# Patient Record
Sex: Male | Born: 1970 | Race: White | Hispanic: Yes | Marital: Married | Smoking: Never smoker
Health system: Southern US, Community
[De-identification: ages and names within clinical notes are randomized; demographics above are authoritative.]

## PROBLEM LIST (undated history)

## (undated) DIAGNOSIS — K219 Gastro-esophageal reflux disease without esophagitis: Secondary | ICD-10-CM

## (undated) HISTORY — DX: Gastro-esophageal reflux disease without esophagitis: K21.9

---

## 2004-02-27 ENCOUNTER — Encounter: Admission: RE | Admit: 2004-02-27 | Discharge: 2004-02-27 | Payer: Self-pay | Admitting: Family Medicine

## 2006-05-21 ENCOUNTER — Ambulatory Visit: Payer: Self-pay | Admitting: Family Medicine

## 2006-10-15 ENCOUNTER — Ambulatory Visit: Payer: Self-pay | Admitting: Sports Medicine

## 2006-10-17 ENCOUNTER — Ambulatory Visit: Payer: Self-pay | Admitting: Family Medicine

## 2006-10-30 DIAGNOSIS — K219 Gastro-esophageal reflux disease without esophagitis: Secondary | ICD-10-CM | POA: Insufficient documentation

## 2006-10-30 DIAGNOSIS — R079 Chest pain, unspecified: Secondary | ICD-10-CM | POA: Insufficient documentation

## 2006-10-30 DIAGNOSIS — IMO0002 Reserved for concepts with insufficient information to code with codable children: Secondary | ICD-10-CM

## 2006-11-21 ENCOUNTER — Ambulatory Visit (HOSPITAL_COMMUNITY): Admission: RE | Admit: 2006-11-21 | Discharge: 2006-11-21 | Payer: Self-pay | Admitting: Family Medicine

## 2006-11-21 ENCOUNTER — Ambulatory Visit: Payer: Self-pay | Admitting: Family Medicine

## 2006-11-21 ENCOUNTER — Encounter (INDEPENDENT_AMBULATORY_CARE_PROVIDER_SITE_OTHER): Payer: Self-pay | Admitting: Family Medicine

## 2006-11-24 ENCOUNTER — Ambulatory Visit: Payer: Self-pay | Admitting: Family Medicine

## 2006-11-24 ENCOUNTER — Encounter (INDEPENDENT_AMBULATORY_CARE_PROVIDER_SITE_OTHER): Payer: Self-pay | Admitting: Family Medicine

## 2006-11-24 LAB — CONVERTED CEMR LAB
Cholesterol: 191 mg/dL (ref 0–200)
HDL: 38 mg/dL — ABNORMAL LOW (ref >39)
Total CHOL/HDL Ratio: 5
Triglycerides: 72 mg/dL (ref <150)
VLDL: 14 mg/dL (ref 0–40)

## 2006-11-25 ENCOUNTER — Encounter (INDEPENDENT_AMBULATORY_CARE_PROVIDER_SITE_OTHER): Payer: Self-pay | Admitting: *Deleted

## 2006-11-25 ENCOUNTER — Encounter (INDEPENDENT_AMBULATORY_CARE_PROVIDER_SITE_OTHER): Payer: Self-pay | Admitting: Family Medicine

## 2007-07-16 ENCOUNTER — Encounter (INDEPENDENT_AMBULATORY_CARE_PROVIDER_SITE_OTHER): Payer: Self-pay | Admitting: Family Medicine

## 2007-08-13 ENCOUNTER — Ambulatory Visit: Payer: Self-pay | Admitting: Family Medicine

## 2007-08-13 DIAGNOSIS — M25569 Pain in unspecified knee: Secondary | ICD-10-CM

## 2008-06-17 ENCOUNTER — Telehealth (INDEPENDENT_AMBULATORY_CARE_PROVIDER_SITE_OTHER): Payer: Self-pay | Admitting: *Deleted

## 2009-06-15 ENCOUNTER — Ambulatory Visit: Payer: Self-pay | Admitting: Family Medicine

## 2009-06-15 DIAGNOSIS — K089 Disorder of teeth and supporting structures, unspecified: Secondary | ICD-10-CM | POA: Insufficient documentation

## 2009-09-08 ENCOUNTER — Telehealth: Payer: Self-pay | Admitting: Family Medicine

## 2009-09-08 ENCOUNTER — Ambulatory Visit: Payer: Self-pay | Admitting: Family Medicine

## 2009-09-08 DIAGNOSIS — S058X9A Other injuries of unspecified eye and orbit, initial encounter: Secondary | ICD-10-CM | POA: Insufficient documentation

## 2009-09-18 ENCOUNTER — Telehealth: Payer: Self-pay | Admitting: Family Medicine

## 2009-09-19 ENCOUNTER — Encounter: Payer: Self-pay | Admitting: Family Medicine

## 2009-09-19 ENCOUNTER — Ambulatory Visit: Payer: Self-pay | Admitting: Family Medicine

## 2009-09-22 ENCOUNTER — Encounter: Payer: Self-pay | Admitting: Family Medicine

## 2010-10-03 NOTE — Miscellaneous (Signed)
Summary: dentist number  Clinical Lists Changes Lonnie Holt, our interpretor called with the number to his dentist. he told her we wanted this number. [phone].Golden Circle RN  September 19, 2009 3:08 PM

## 2010-10-03 NOTE — Assessment & Plan Note (Signed)
Summary: something in eye/[place]   Vital Signs:  Patient profile:   40 year old male Height:      65.75 inches Weight:      165 pounds BMI:     26.93 Temp:     97.5 degrees F Pulse rate:   74 / minute BP sitting:   120 / 83  (right arm) Cuff size:   regular  Vitals Entered By: Dennison Nancy RN (September 08, 2009 4:25 PM) CC: [place] for someting in right eye   Primary Care Provider:  Asher Muir MD  CC:  [place] for someting in right eye.  History of Present Illness: 40 yo male with c/o of something in his right eye.  Translator present during exam. Was working in his garage at home, using a power saw and no goggles.  Was cutting wood and metal, felt something shoot into his eye.  This happened 2 days ago.  C/o of blurry vision and pain.  No loss of vision.  Has not removed anything from his eye.  Has not used any drops or seen anyone prior to exam today.   Habits & Providers  Alcohol-Tobacco-Diet     Tobacco Status: never  Allergies (verified): No Known Drug Allergies  Past History:  Social History: Last updated: 11/21/2006 LIVES W/ WIFE AND SISTER-IN-LAW.  2 KIDS. BEEN IN [place] SINCE 2000, FROM [place].   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  pREVIOUSLY WAS A [employer].  DENIES SMOKING.  RARE ETOH.  NO DRUGS.  Risk Factors: Smoking Status: never (09/08/2009) Passive Smoke Exposure: no (11/21/2006)  Physical Exam  General:  Well-developed,well-nourished,in no acute distress; alert,appropriate and cooperative throughout examination Eyes:  vision grossly intact, pupils equal, pupils round, and pupils reactive to light.  On woods lamp exam, small corneal abrasion noted at 7 oclock in right eye.  No foregin objects noted   Impression & Recommendations:  Problem # 1:  CORNEAL ABRASION, LEFT (ICD-918.1) Assessment New Verified corneal abrasion with woods lamp exam.  No forgein body noted in either eye. Gave pt tobradex and  diclofenac eye drops, please see pt instructions. Orders: [facility]- Est Level  3 ([id number])  Complete Medication List: 1)  Aleve 220 Mg Tabs (Naproxen sodium) .... Two tablets twice a day for pain.  please take for 7 days 2)  Tramadol Hcl 50 Mg Tabs (Tramadol hcl) .... Take 1 tablet every 6 hous as needed or pain 3)  Penicillin V Potassium 500 Mg Tabs (Penicillin v potassium) .... Take two times a day x 10 days. please give instructions in spanish.  Patient Instructions: 1)  Use tobradex, one drop every 6 hours for 3 days 2)  Use 1 drop of diclofenac in every 6 hours x 3 days for pain 3)  If vision worsen, go to ED

## 2010-10-03 NOTE — Assessment & Plan Note (Signed)
Summary: dental pain, bleeding at gums now/ls   Vital Signs:  Patient profile:   40 year old male Height:      65.75 inches Weight:      168 pounds BMI:     27.42 BSA:     1.85 Temp:     116 degrees F Pulse rate:   76 / minute  Vitals Entered By: Jone Baseman CMA (September 19, 2009 9:25 AM) CC: dental pain and bleeding gums Is Patient Diabetic? No Pain Assessment Patient in pain? yes     Location: right side of mouth Intensity: 6   Primary Care Provider:  Asher Muir MD  CC:  dental pain and bleeding gums.  History of Present Illness: 40 y/o male seen in clinic in October 2010 for dental pain. given rx for pain meds and antibiotics. states pain in R molar improved but is now returning and progressively worsening. awaiting referral to dentistry. no swelling, difficulty eating, fever. occasional taste of blood in his mouth. through phone interpreter, patient states he completed antibiotics about 2 weeks ago (not sure where he obtained those antibiotics from).   Habits & Providers  Alcohol-Tobacco-Diet     Tobacco Status: never  Current Medications (verified): 1)  Aleve 220 Mg  Tabs (Naproxen Sodium) .... Two Tablets Twice A Day For Pain.  Please Take For 7 Days  Allergies (verified): No Known Drug Allergies  Physical Exam  General:  Well-developed,well-nourished,in no acute distress; alert,appropriate and cooperative throughout examination Mouth:  fair dentition., no obvious gum swelling or redness around molars bilateral,  No pus drainage. No fluctuance present at gum line.   Impression & Recommendations:  Problem # 1:  DENTAL PAIN (ICD-525.9) Assessment Deteriorated  reiterated that dental referrals for uninsured take a long time. patient states that dentist he went to is just waiting on referral from [place]. due to confusion, patient to obtain number for dentist office. no indication for antibiotics at this time. will send note to PCP.   Orders: [facility]- Est  Level  3 ([id number])  Complete Medication List: 1)  Aleve 220 Mg Tabs (Naproxen sodium) .... Two tablets twice a day for pain.  please take for 7 days

## 2010-10-03 NOTE — Progress Notes (Signed)
Summary: triage  Phone Note Call from Patient Call back at Home Phone [phone]   Caller: Spouse Summary of Call: has something in eye and needs to be seen today Initial call taken by: De Nurse,  September 08, 2009 11:41 AM  Follow-up for Phone Call        wife's english was not adequate. appt at 3pm workin. interpretor arranged he has something in his eye & it hurts. unable to come at 1:30 Follow-up by: Golden Circle RN,  September 08, 2009 11:42 AM

## 2010-10-03 NOTE — Miscellaneous (Signed)
Summary: dental referral ordered  Clinical Lists Changes  Orders: Added new Referral order of Dental Referral (Dentist) - Signed

## 2010-10-03 NOTE — Progress Notes (Signed)
Summary: referral  Phone Note Call from Patient Call back at Home Phone [phone]   Caller: Madie Reno Summary of Call: needs to be referred to Dental Clinic Initial call taken by: De Nurse,  September 18, 2009 3:00 PM  Follow-up for Phone Call        explained that referral was sent to dental clinic in October and it can take months to get appointment. patient is having much pain now and bleeding gums.  appointment scheduled tomorrow for work in .[place] Follow-up by: Theresia Lo RN,  September 18, 2009 3:38 PM

## 2010-10-21 ENCOUNTER — Encounter: Payer: Self-pay | Admitting: *Deleted

## 2010-12-19 ENCOUNTER — Inpatient Hospital Stay (INDEPENDENT_AMBULATORY_CARE_PROVIDER_SITE_OTHER)
Admission: RE | Admit: 2010-12-19 | Discharge: 2010-12-19 | Disposition: A | Discharge status: Home / Self Care | Payer: Self-pay | Source: Ambulatory Visit | Attending: Family Medicine | Admitting: Family Medicine

## 2010-12-19 DIAGNOSIS — T22019A Burn of unspecified degree of unspecified forearm, initial encounter: Secondary | ICD-10-CM

## 2011-01-08 ENCOUNTER — Ambulatory Visit (INDEPENDENT_AMBULATORY_CARE_PROVIDER_SITE_OTHER): Payer: Self-pay | Admitting: Sports Medicine

## 2011-01-08 ENCOUNTER — Encounter: Payer: Self-pay | Admitting: Sports Medicine

## 2011-01-08 VITALS — BP 125/82 | HR 80 | Temp 98.1°F | Wt 170.0 lb

## 2011-01-08 DIAGNOSIS — M25519 Pain in unspecified shoulder: Secondary | ICD-10-CM

## 2011-01-08 DIAGNOSIS — M25511 Pain in right shoulder: Secondary | ICD-10-CM | POA: Insufficient documentation

## 2011-01-08 MED ORDER — MELOXICAM 7.5 MG PO TABS: ORAL_TABLET | ORAL | Status: AC

## 2011-01-08 NOTE — Assessment & Plan Note (Addendum)
With impingement signs present. Moderate protruding bursa and gap in supraspinatus tendon on MSK [place]. Resolution in pain on injection. Suspect rotator cuff impingement syndrome. Mobic for pain. Exercises given. RTC prn.

## 2011-01-08 NOTE — Patient Instructions (Addendum)
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 El manguito rotador est formado por cuatro tendones y msculos que controlan gran parte de la funcin del hombro y Probation officer.  La bursa subacromial es un saco lleno de lquido que ayuda a reducir la friccin entre el manguito rotador y uno de los huesos del hombro (acromion). El sndrome de atrapamiento suele ser Neomia Dear lesin por uso excesivo que causa inflamacin de la bursa (bursitis), inflamacin del tendn (tendinitis), o un desgarro del tendn (esguince).  Los esguinces se clasifican en tres categoras. Los esguinces de grado 1 ocasionan dolor, pero el tendn no est alargado. En los esguinces de grado 2 hay un ligamento alargado, debido a un estiramiento o desgarro parcial. En el esguince de [place] 2 an se conserva la funcin, aunque puede estar disminuda. Un esguince en grado 3 es la ruptura completa del msculo o el tendn, y suele quedar incapacitada la funcin.   SNTOMAS  Dolor alrededor del hombro, frecuentemente en la porcin externa de la parte superior del brazo.  El dolor empeora al mover el hombro, en especial cuando se lo levanta o se lo coloca por encima de la cabeza.  En ocasiones hay dolor cuando no se utiliza el brazo.  El dolor puede despertarlo durante la noche.  A veces, puede sentir dolor, sensibilidad, hinchazn, calor y enrojecimiento en la zona afectada.  Prdida de la fuerza.  Movimiento limitado del hombro, en especial para moverlo hacia atrs (cmo para alcanzar el bolsillo trasero o desabrocharse el sostn) o al cruzar el cuerpo.    Ruido de "crack" (crepitacin) al Educational psychologist.  Puede sentir dolor en el tendn del bceps e hinchazn (en la zona  anterior del hombro). Empeora al doblar el codo o levantar peso.   CAUSAS El sndrome de compresin es a menudo una lesin por abuso, [employer] movimientos crnicos (repetitivos) ocasionan que los tendones o la bursa se inflamen.   El esguince se produce cuando se produce una fuerza en el tendn o msculo que es mayor de lo que puede soportar. Los mecanismos ms frecuentes de una lesin son: Esguince provocado por un brusco incremento en la duracin, frecuencia o en la intensidad del entrenamiento.  Golpe directo en el hombro (traumatismo).  Envejecimiento, degeneracin del tendn con el uso normal.  Bulto seo en el hombro (osteofito acromial).   LOS RIESGOS AUMENTAN CON  Deportes de contacto (ftbol, lucha o boxeo).  Deportes en los que hay que arrojar objetos (bisbol, tenis y vley).  Levantamiento de pesas y fisicoculturismo.  Trabajos pesados.  Lesin previa en el manguito rotador, inclusive choque.  Poca fuerza y flexibilidad del hombro.  Precalentamiento y elongacin inadecuados antes de la [place].  Equipo protector inadecuado.  La edad  Bulto seo en el hombro (osteofito acromial).   MEDIDAS DE PREVENCIN  Precalentamiento adecuado y elongacin antes de la [place].  Descanso y recuperacin entre actividades.  Mantener la forma fsica: l Fuerza, flexibilidad y  resistencia muscular. l Capacidad cardiovascular.  Aprenda y USAA.   PRONSTICO Si se trata adecuadamente, por lo general desaparece en 6 semanas.  En algunos casos se requiere [place].     POSIBLES COMPLICACIONES:  Tiempo de curacin prolongado, si no se Fish farm manager suficiente como  para curarse.  La recurrencia frecuente de los sntomas puede dar como resultado un problema crnico.  Rigidez, parlisis del hombro o prdida de movimiento.  Desgarro del tendn del Engineer, drilling.  Recurrencia de sntomas, en especial si se retoma la actividad  rpidamente, con el uso excesivo, con un golpe directo o con tcnicas incorrectas.   CONSIDERACIONES GENERALES PARA EL TRATAMIENTO El tratamiento inicial consiste en la toma de medicamentos y la aplicacin de hielo para Engineer, materials y reducir la hinchazn. Los ejercicios de elongacin y fortalecimiento pueden ayudar a reducir Chief Technology Officer con la [place]. Los ejercicios pueden Management consultant o con un terapeuta. Si no se obtiene xito con Artist, ser necesario someterse a [place]. Luego de la [place] y rehabilitacin, es posible regresar a Audiological scientist en 3 meses.     MEDICAMENTOS    Si es necesaria la administracin de medicamentos para Chief Technology Officer, se recomiendan los antiinflamatorios no esteroides, (aspirina e ibuprofeno) u otros calmantes menores (acetaminofeno).    No tome medicamentos para el dolor dentro de los [address] previos a la [place].    El profesional podr prescribirle calmantes si lo considera necesario. Utilcelos como se le indique y slo cuando lo necesite.  En algunos casos se indica una inyeccin de corticosteroides.  Estas inyecciones deben reservarse para los [place] graves, porque slo se pueden administrar una determinada cantidad de veces.   CALOR Y FRO:    El fro debe aplicarse durante 10 a 15 minutos cada  2  3 horas para reducir la inflamacin y Chief Technology Officer e inmediatamente despus de cualquier actividad que agrava los sntomas. Utilice bolsas o un masaje de hielo.  El calor puede usarse antes de Therapist, music y de las actividades de fortalecimiento indicadas por el profesional, el fisioterapeuta o Orthoptist. Utilice una bolsa trmica o un pao hmedo.     SOLICITE ATENCIN MDICA SI:    Los sntomas empeoran o no mejoran en 4 a 6 semanas, an realizando Pharmacist, community.  Desarrolla nuevos e inexplicables sntomas. (las drogas [employer] en el tratamiento le ocasionan efectos secundarios).     EJERCICIOS   EJERCICIOS DE  AMPLITUD DE MOVIMIENTOS Y ELONGACIN - Sndrome de choque (manguito rotador tendinitis, bursitis) Estos ejercicios le ayudarn en la recuperacin de la lesin. Los sntomas podrn desaparecer con o sin mayor intervencin del profesional, el fisioterapeuta o Orthoptist. Al completar estos ejercicios, recuerde:    Restaurar la flexibilidad del tejido ayuda a que las articulaciones recuperen el movimiento normal. Esto permite que el movimiento y la actividad sea ms saludables y menos dolorosos.  Para que sea efectiva, cada elongacin debe realizarse durante al menos 30 segundos.  La elongacin nunca debe ser dolorosa. Deber sentir slo un alargamiento suave o elongacin del tejido que estira.       FUERZA - Flexin, de pie  Pngase de pie con una buena postura. Con un agarre en supinacin en su mano __________, y un agarre en pronacin de la mano opuesta, agarre un palo de escoba o caa de modo que sus manos queden un poco ms separadas que el ancho de los hombros.  Con los msculos del codo __________ [employer] y los hombros relajados, empuje el bastn con la mano opuesta, para elevar su brazo __________ delante de su cuerpo y por encima de la cabeza. Levante el brazo hasta que sientas un estiramiento en el hombro __________, pero sin sentir un aumento del dolor.  Trate de evitar encogerse  el hombro __________ a medida que levanta el brazo, y [place] el omplato inclinado hacia abajo y hacia la espina dorsal media de la espalda. Mantenga esta posicin durante 10_ segundos.  Vuelva lentamente a la posicin inicial.   Reptalo _10_ veces. Realice este estiramiento __3_ veces por da.     FUERZA - Abduccin, posicin supina  Acustese sobre la espada. Tome un palo de escoba o caa con una palma __________ Phoebe Sharps y la otra palma hacia arriba de modo que sus manos tengan una separacin de un poco ms que el ancho de sus hombros..    Con los msculos del codo __________ [employer] y los hombros  relajados, empuje el bastn con la mano opuesta, para elevar su brazo __________ delante de su cuerpo y por encima de la cabeza. Levante el brazo hasta que sientas un estiramiento en el hombro __________, pero sin sentir un aumento del dolor.  Trate de evitar encogerse el hombro __________ a medida que levanta el brazo, y Ali Molina el omplato inclinado hacia abajo y hacia la espina dorsal media de la espalda. Mantenga esta posicin durante 10_ segundos.  Vuelva lentamente a la posicin inicial.   Reptalo _10_ veces. Realice este estiramiento __3_ veces por da.     ROM - Flexin, asistida activa  Acustese sobre la espada. Podr doblar las rodillas para estar ms cmodo  Tome un palo de escoba o un bastn de modo que sus manos queden a la misma distancia que sus hombros. La mano __________ Dyke Brackett el extremo del palo, de modo que la mano est "pulgares arriba", como si estuviera a punto de dar la [place].  Con su brazo sano para conducir, Oceanographer __________ Stasia Cavalier cabeza, hasta que sienta un suave estiramiento en el hombro.  Mantenga esta posicin durante 10_ segundos.  Vuelva lentamente a la posicin inicial.   Reptalo _10_ veces. Realice este estiramiento __3_ veces por da.    ROM - Rotacin interna, en posicin supina  Recustese sobre su espalda en una superficie firme.  Coloque el codo __________ a 60 grados de distancia de su lado. Eleve el codo con una toalla doblada, de manera que el codo y el hombro estn a  la misma altura.  Utilice un palo de escoba o caa y su brazo sano, tire de su mano __________ Normajean Glasgow su cuerpo hasta que sienta un suave estiramiento, pero ningn aumento en su dolor en el hombro.  Mantenga su hombro y el codo en su lugar durante todo el ejercicio.  Mantenga esta posicin durante 10_ segundos.  Vuelva lentamente a la posicin inicial.   Reptalo _10_ veces. Realice este estiramiento __3_ veces por da.    ELONGACIN - Rotacin interna   Coloque la mano __________ detrs de la espalda, con la palma Normajean Glasgow arriba.  Coloque una toalla o un cinturn sobre el hombro opuesto.  Tome la toalla con la mano __________.  Con una postura erguida, tire suavemente hacia arriba la [place], hasta que sienta un estiramiento en la parte frontal del hombro __________.  Trate de evitar encogerse el hombro __________ a medida que levanta el brazo, y [place] el omplato inclinado hacia abajo y hacia la espina dorsal media de la espalda.    Mantenga esta posicin durante 10_ segundos.  Vuelva lentamente a la posicin inicial.   Reptalo _10_ veces. Realice este estiramiento __3_ veces por da.    ROM - Rotacin interna  Tome un palo con ambas manos por detrs de la espalda,  con las palmas [place].  De pie y erguido en buena postura, deslice el palo hacia arriba por la espalda hasta sentir un suave estiramiento en la zona anterior de los hombros.  Mantenga esta posicin durante 10_ segundos.  Vuelva lentamente a la posicin inicial.   Reptalo _10_ veces. Realice este estiramiento __3_ veces por da.     ELONGACIN - Cpsula posterior del hombro  Prese o sintese en una postura correcta. Cruce el hombro __________ Wendi Maya, y mantngalo a la misma altura que el hombro.  Tire del codo de [employer] el brazo quede cerca de su pecho. Tire hasta que sienta un estiramiento en la parte trasera del hombro.  Mantenga esta posicin durante 10_ segundos.  Vuelva lentamente a la posicin inicial.   Reptalo _10_ veces. Realice este estiramiento __3_ veces por da.    EJERCICIOS DE ESTIRAMIENTO -  Sndrome de choque (manguito rotador, tendinitis, bursitis) Estos ejercicios le ayudarn en la recuperacin de la lesin. Los sntomas podrn desaparecer con o sin mayor intervencin del profesional, el fisioterapeuta o Orthoptist. Al completar estos ejercicios, recuerde:    Los msculos pueden ganar tanto la resistencia como la fuerza que  necesita para sus actividades diarias a travs de ejercicios controlados.  Realice los ejercicios como se lo indic el mdico, el fisioterapeuta o Orthoptist. Aumente la resistencia y las repeticiones segn se le haya indicado.  Podr experimentar dolor o cansancio muscular, pero el dolor o molestia que trata de eliminar a travs de los ejercicios nunca debe empeorar.  Si el dolor empeora, detngase y asegrese de que est siguiendo las directivas correctamente. Si an siente dolor luego de Education officer, environmental lo ajustes necesarios, deber discontinuar el ejercicio hasta que pueda conversar con el profesional sobre el problema.  Durante la recuperacin, evite las actividades o ejercicios que impliquen acciones que le obliguen a Scientific laboratory technician la mano lesionada o el codo encima de la cabeza o detrs de la espalda o la cabeza. Estas posturas tensionan los tejidos que estn tratando de Barrister's clerk.       FUERZA - Depresin y aduccin escapular   Sintese con una buena postura en una silla firme. Apoye los brazos delante de usted, con [place], reposabrazos, o sobre una mesa. Mantenga los codos en lnea con los lados de su cuerpo.  Lleve suavemente los omplatos hacia abajo y hacia la zona media posterior de la columna. Aumente gradualmente la tensin, sin tensar los msculos de la parte superior de los hombros y la parte posterior de su cuello.  Mantenga esta posicin durante 10_ segundos.  Vuelva lentamente a la posicin inicial.   Reptalo _10_ veces. Realice este estiramiento __3_ veces por da.    FUERZA - Abductores del hombro, isomtrica  Con una buena postura, de pie o sentado cerca de 4-6 pulgadas de la pared, con su lado __________ Rueben Bash al muro.  Doble el codo __________. Empuje suavemente el codo __________ contra la pared. Aumente gradualmente la presin [employer] pueda sin llegar a encojer el hombro ni Tax inspector.  Mantenga esta posicin durante 10_ segundos.  Vuelva lentamente a la  posicin inicial.   Reptalo _10_ veces. Realice este estiramiento __3_ veces por da.   FUERZA - Rotacin externa, isomtrica  Mantenga el codo __________ a su lado y dblelo a 90 grados.  Prese en un marco de puerta para que el exterior de Publishing rights manager __________ pueda presionar contra ste sin separar su brazo de su lado.  Con suavidad presione  la [employer] __________ contra el marco de la puerta, como si estuviera tratando de llevar el dorso de su mano lejos de su estmago. Aumente la presin de forma gradual, tan fuerte como usted pueda, sin encogerse de hombros ni sentir un aumento de las [employer].  Mantenga esta posicin durante 10_ segundos.  Vuelva lentamente a la posicin inicial.   Reptalo _10_ veces. Realice este estiramiento __3_ veces por da.      FUERZA - Supraespinoso  Pngase de pie o sintese con una buena postura. Sostenga un peso de __________ Cheron Schaumann banda de goma para ejercicios, de modo que su mano quede con el pulgar hacia arriba, como cuando [employer].  Levante lentamente el brazo __________ separndolo del muslo en forma de "V", en diagonal por el espacio entre el costado y el frente.  Levante la mano [employer] altura del hombro o tanto como pueda, sin Tax inspector.  Al principio, muchas personas no pueden levantar las manos por arriba de la altura del hombro.    Trate de evitar encogerse el hombro __________ a medida que levanta el brazo, y [place] el omplato inclinado hacia abajo y hacia la espina dorsal media de la espalda.    Mantenga esta posicin durante 10_ segundos.  Vuelva lentamente a la posicin inicial.   Reptalo _10_ veces. Realice este estiramiento __3_ veces por da.     FUERZA - Rotadores externos  Asegure una banda o tubo de goma a un objeto fijo (mesa, columna) de modo que quede a la misma altura que su codo __________ [employer]	[place] se encuentre de pie o sentado sobre una superficie firme.  Prese o sintese de [employer] banda de goma se encuentre de su lado lesionado.    Doble el codo __________ a 90 grados. Coloque una toalla doblada o una pequea almohada debajo de su brazo __________ Reina Fuse que su codo quede algunas pulgadas separado de su cuerpo.  Manteniendo la tensin en la banda de goma, tire de la misma hacia afuera de su cuerpo, como pivoteando en el codo. Asegrese de [employer] su cuerpo derecho, de modo que el movimiento slo provenga de la rotacin del hombro.  Mantenga esta posicin durante 10_ segundos.  Vuelva lentamente a la posicin inicial.   Reptalo _10_ veces. Realice este estiramiento __3_ veces por da.     FUERZA - Rotadores internos  Asegure una banda o tubo de goma a un objeto fijo (mesa, columna) de modo que quede a la misma altura que su codo __________ [employer]	[place] se encuentre de pie o sentado sobre una superficie firme.  Prese o sintese de [employer] Home	[place] banda de goma se encuentre de su lado __________.    Doble el codo a 90 grados.  Coloque una toalla doblada o una pequea almohada debajo de su brazo __________ Reina Fuse que su codo quede algunas pulgadas separado de su cuerpo.  Manteniendo la tensin en la banda, tire de la misma cruzando sobre su cuerpo, [place]. Asegrese de [employer] su cuerpo derecho, de modo que el movimiento slo provenga de la rotacin del hombro.  Mantenga esta posicin durante 10_ segundos.  Vuelva lentamente a la posicin inicial.   Reptalo _10_ veces. Realice este estiramiento __3_ veces por da.      FUERZA - Protractor escapular - de pie  Prese la distancia de sus brazos separado de la pared. Coloque las [employer] pared, [employer] codos derechos.  Comience con los  omplatos hacia abajo y hacia la zona media posterior de la columna.  Para fortalecer los extensores, [address] los omplatos [place], [place] deslcelos hacia adelante sobre el trax. Sentir como si estuviera separada la zona posterior del trax de la  pared. Es movimiento sutil y puede ser difcil de Education officer, environmental. Consulte con su mdico para que d ms instrucciones si no est seguro de Aeronautical engineer.  Mantenga esta posicin durante 10_ segundos.  Vuelva lentamente a la posicin inicial.   Reptalo _10_ veces. Realice este estiramiento __3_ veces por da.    FUERZA IT consultant - en posicin [place]  Recustese sobre su espalda en una superficie firme. Extienda su brazo __________ recto en el aire mientras sostiene un peso de __________ libras en la mano.  Mantenga la cabeza y la espalda en su lugar, levante los hombros del piso.  Mantenga esta posicin durante 10_ segundos.  Vuelva lentamente a la posicin inicial.   Reptalo _10_ veces. Realice este estiramiento __3_ veces por da.    FUERZA - Protractor escapular - Office manager en las rodillas y las manos, con los hombros directamente sobre las manos (o tan prximos como pueda).    Manteniendo los hombros trabados, levante la zona posterior del trax hacia los omplatos, de modo que la zona media de la espalda se redondee. Mantenga los msculos del cuello relajados.    Mantenga esta posicin durante 10_ segundos.  Vuelva lentamente a la posicin inicial.   Reptalo _10_ veces. Realice este estiramiento __3_ veces por da.     FUERZA - Retractor escapular  Asegure una banda o tubo de goma a un objeto fijo (mesa, columna) de modo que quede a la misma altura que sus hombros mientras se encuentre de pie o sentado en una silla firme sin apoyabrazos.  Con las palmas [place], tome un extremo de la banda con cada mano. Enderece los codos y levante las manos directamente frente usted, a la altura de los hombros. [place] hacia atrs, alejndose del extremo fijo de la banda, [place] que se tense.  Tratando de juntar presionando los omplatos, lleve los codos hacia atrs [employer] dobla. Mantenga los brazos separados del cuerpo durante todo el  ejercicio.  Mantenga esta posicin durante 10_ segundos.  Vuelva lentamente a la posicin inicial.   Reptalo _10_ veces. Realice este estiramiento __3_ veces por da.     FUERZA - Extensores del hombro  Asegure una banda o tubo de goma a un objeto fijo (mesa, columna) de modo que quede a la misma altura que sus hombros mientras se encuentre de pie o sentado en una silla firme sin apoyabrazos.  Con los pulgares [place], tome un extremo de la banda con cada mano. Enderece los codos y levante las manos directamente frente usted, a la altura de los hombros. Camine hacia atrs, alejndose del extremo fijo de la banda, [place] que se tense.  Presionando los omplatos para juntarlos, lleve las manos a los lados de los muslos. No deje que las manos vayan ms all.    Mantenga esta posicin durante 10_ segundos.  Vuelva lentamente a la posicin inicial.   Reptalo _10_ veces. Realice este estiramiento __3_ veces por da.     FUERZA - Retractores escapulares y rotadores externos    Asegure una banda o tubo de goma a un objeto fijo (mesa, columna) de modo que quede a la misma altura que sus hombros mientras se encuentre de pie o sentado en una silla firme  sin apoyabrazos.  Con las palmas [place], tome un extremo de la banda con cada mano. Doble los hombros a 90 grados y [employer] codos [employer] altura de los hombros, a los lados. Camine hacia atrs, alejndose del extremo fijo de la banda, [place] que se tense.  Presionando los omplatos para juntarlos, rote los hombros de modo que la zona superior de los brazos y codos Special educational needs teacher quietos, [employer] puos se eleven hasta la altura de la cabeza.  Mantenga esta posicin durante 10_ segundos.  Vuelva lentamente a la posicin inicial.   Reptalo _10_ veces. Realice este estiramiento __3_ veces por da.     FUERZA - Retractores escapulares y rotadores externos - remo  Asegure una banda o tubo de goma a un objeto fijo (mesa, columna) de  modo que quede a la misma altura que sus hombros mientras se encuentre de pie o sentado en una silla firme sin apoyabrazos.  Con las palmas [place], tome un extremo de la banda con cada mano. Enderece los codos y levante las manos directamente frente usted, a la altura de los hombros. Camine hacia atrs, alejndose del extremo fijo de la banda, [place] que se tense.  Paso 1: [employer] ambos omplatos. Doble los codos, lleve las manos al pecho, como si [place]. Al final de [employer], las manos y codos deben quedar a la altura del hombro, y los codos deben estar separados de los lados.  Paso 2: Rote los hombros, para Optometrist las manos por arriba de la cabeza. Los antebrazos deben quedar verticales y la zona superior de los brazos debe quedar horizontal.  Mantenga esta posicin durante 10_ segundos.  Vuelva lentamente a la posicin inicial.   Reptalo _10_ veces. Realice este estiramiento __3_ veces por da.       FUERZA - Depresores escapulares  Busque una silla maciza sin ruedas, como una silla de comedor.  Manteniendo los pies sobre el piso y las manos en los apoyabrazos, levante las nalgas del asiento y trabe los codos.  Manteniendo los codos rectos, deje que la gravedad empuje su cuerpo [place] abajo. Los hombros se elevarn [employer].  Eleve el cuerpo oponindose a la gravedad, llevando los omplatos hacia la espalda y acortando la distancia entre los hombros y [place].  Aunque los pies mantengan contacto con el piso, deben soportar cada vez menos peso, a medida que se fortalece.    Mantenga esta posicin durante 10_ segundos.  Vuelva lentamente a la posicin inicial.   Reptalo _10_ veces. Realice este estiramiento __3_ veces por da.   Document Released: 06/05/2006  Document Re-Released: 08/07/2009 [facility] Patient Information 2011 [place], [place].

## 2011-01-08 NOTE — Progress Notes (Addendum)
  Subjective:    Patient ID: Lonnie Holt, male    DOB: Jun 01, 1971, 40 y.o.   MRN: [id number]  HPI Pain in R shoulder, x 1 day, just below acromion, no trauma,  Works in [employer].  Painful with shoulder abduction and flexion.  Hasn't tried any medications.  No radiation.  No fevers/chills.  Review of Systems    See HPI Objective:   Physical Exam  Constitutional: He appears well-developed and well-nourished. No distress.  Musculoskeletal:       R Shoulder: Inspection reveals no abnormalities, atrophy or asymmetry. Palpation is normal with no tenderness over AC joint or bicipital groove. ROM is full but limited by pain to flexion and abduction. Positive cross arm test. Rotator cuff strength weak throughout. All signs of impingement present with positive Neer and Hawkin's tests, empty can sign. Speeds and Yergason's tests normal. No labral pathology noted with negative Obrien's, negative clunk and good stability. Normal scapular function observed. No painful arc and no drop arm sign. No apprehension sign  Skin: Skin is warm and dry.   MSK US performed of: R shoulder  Shoulder:   Supraspinatus:  Appears to have small tear on undersurface on long view, moderate bursal bulge seen with shoulder abduction on impingement view. Infraspinatus:  Appears normal on long and transverse views. Subscapularis:  Appears normal on long and transverse views. Teres Minor:  Appears normal on long and transverse views. AC joint:  Capsule undistended, no geyser sign. Biceps Tendon:  Appears normal on long and transverse views, no fraying of tendon, tendon located in intertubercular groove, no subluxation with shoulder internal or external rotation.     Consent obtained and verified. Time-out conducted. Noted no overlying erythema, induration, or other signs of local infection. Sterile betadine prep. Furthur cleansed with alcohol. Topical analgesic spray: Ethyl chloride. Joint: R  subacromial Approached in typical fashion with: 25g needle Completed without difficulty Meds: 1cc kenalog 40, 3 cc lidocaine 1% no epi. Advised to call if fevers/chills, erythema, induration, drainage, or persistent bleeding.  Assessment & Plan:

## 2011-01-10 ENCOUNTER — Telehealth: Payer: Self-pay | Admitting: *Deleted

## 2011-01-10 NOTE — Telephone Encounter (Signed)
Miranes,  Can you please call this patient for me and explain below Thanks -----Huntley Dec

## 2011-01-10 NOTE — Telephone Encounter (Signed)
Message copied by Jimmy Footman on Thu Jan 10, 2011  2:18 PM ------      Message from: Rodney Langton      Created: Wed Jan 09, 2011  8:35 PM       Pls have Ziquan called and have him come back to see me if shoulder no better in a month.  You will need an interpreter.      Ihor Austin. Benjamin Stain, M.D.

## 2011-01-11 ENCOUNTER — Telehealth (HOSPITAL_COMMUNITY): Payer: Self-pay | Admitting: Family Medicine

## 2011-01-11 NOTE — Telephone Encounter (Signed)
I called Pt just for F/U about pt shoulder pt is happy with our attention and shoulder is much better.

## 2011-05-23 ENCOUNTER — Ambulatory Visit (INDEPENDENT_AMBULATORY_CARE_PROVIDER_SITE_OTHER): Payer: Self-pay | Admitting: Family Medicine

## 2011-05-23 VITALS — BP 111/74 | HR 81 | Temp 98.1°F | Wt 168.8 lb

## 2011-05-23 DIAGNOSIS — J029 Acute pharyngitis, unspecified: Secondary | ICD-10-CM | POA: Insufficient documentation

## 2011-05-23 DIAGNOSIS — J069 Acute upper respiratory infection, unspecified: Secondary | ICD-10-CM

## 2011-05-23 MED ORDER — AMOXICILLIN 875 MG PO TABS: 875.0000 mg | ORAL_TABLET | Freq: Two times a day (BID) | ORAL | Status: AC

## 2011-05-23 MED ORDER — IBUPROFEN 800 MG PO TABS: 800.0000 mg | ORAL_TABLET | Freq: Three times a day (TID) | ORAL | Status: AC | PRN

## 2011-05-23 MED ORDER — BENZONATATE 100 MG PO CAPS: 100.0000 mg | ORAL_CAPSULE | Freq: Four times a day (QID) | ORAL | Status: AC | PRN

## 2011-05-23 NOTE — Patient Instructions (Signed)
Pienso que tiene una infeccion de la garganta de un virus. Pero si tengas sintomas peores, puede tomar el antibiotico, dos veces por cinco dias.  Trate la medicina contra la tos. Para el dolor de garganta, pueda tomar ibuprofen fuerte.   Espero que siente mejor. Mucho gusto. Faringitis (viral y [place]) (Pharyngitis, Viral and Bacterial) Es una inflamacin (irritacin) o infeccin de la faringe. Tambin se denomina dolor de garganta.   CAUSAS La mayor parte de las anginas son causadas por virus y son parte de un resfro. Sin embargo, algunas anginas son ocasionadas por la bacteria estreptococo (germen) o por otras bacterias. La causa de la angina tambin puede ser el goteo nasal proveniente de los senos [place], [place] y, en algunos [place], se produce incluso por dormir con la boca abierta. Las anginas infecciosas pueden contagiarse de Neomia Dear persona a otra por la tos, el estornudo y por compartir tasas o cubiertos. TRATAMIENTO Las anginas de causa viral generalmente duran entre 3 y [address] Estas infecciones virales generalmente mejoran sin antibiticos (medicamentos que destruyen grmenes). Las anginas por estreptococo y otras bacterias (grmenes) generalmente mejoran despus de 24 a 48 horas de Microbiologist con antibiticos. INSTRUCCIONES PARA EL CUIDADO DOMICILIARIO  Si en profesional considera que se trata de una infeccin bacteriana o si hay una prueba positiva para Event organiser, Administrator, sports un antibitico. Debe tomar todos los antibiticos Librarian, academic. Si no completa el tratamiento con antibiticos, usted o su hijo podrn enfermar nuevamente. Si usted o su hijo presentan un estreptococo en la garganta y no completan el tratamiento, podran sufrir un trastorno grave en el corazn o en los riones.   Beba gran cantidad de lquidos. Alrededor de 8-10 vasos grandes de agua por da. (Puede ser agua, jugos, licuados de frutas, Kool-aid, [place], [place],  etc.).   Utilice los medicamentos de venta libre o de prescripcin para Chief Technology Officer, Environmental health practitioner o la [place], segn se lo indique el profesional que lo asiste.   Descanse lo suficiente.   Hgase grgaras con agua salada (1/2 cucharadita de sal en un vaso de agua) cada 1-2 horas si lo necesita para [employer].   Si el paciente es mayor de [place], ofrzcale caramelos duros o Engineer, manufacturing o pastillas para la tos.  SOLICITE ATENCIN MDICA SI:  Si le aparecen bultos grandes y dolorosos en el cuello.   Presenta una erupcin.   Elimina un esputo verde, marrn-amarillento o sanguinolento.   Usted o su nio tienen una temperatura oral de ms de 102 F (38.9 C).   El beb tiene ms de 3 meses y su temperatura rectal es de 100.5 F (38.1 C) o ms durante ms de 1 da.  SOLICITE ATENCIN MDICA DE INMEDIATO SI:  Siente rigidez en el cuello.   Usted o su hijo babean o no pueden tragar lquidos.   Usted o su hijo vomitan, no pueden retener los [employer].   Usted o su hijo presentan dolor intenso, que no se alivia con los [employer] han recetado.   Usted o su hijo presentan dificultad para respirar (y no se debe a la [place]).   Usted o su hijo no pueden abrir Scientist, product/process development.   Usted o su nio presentan aumento del dolor, hinchazn, enrojecimiento en el cuello.   Usted o su nio tienen una temperatura oral de ms de 102 F (38.9 C) y no puede controlarla con medicamentos.   Su beb tiene ms de 3  meses y su temperatura rectal es de 102 F (38.9 C) o ms.   Su beb tiene 3 meses o menos y su temperatura rectal es de 100.4 F (38 C) o ms.  EST SEGURO QUE:    Comprende las instrucciones para el alta mdica.   Controlar su enfermedad.   Solicitar atencin mdica de inmediato segn las indicaciones.  Document Released: 05/29/2005 Document Re-Released: 11/13/2009 [facility] Patient Information 2011 [place], [place].

## 2011-05-23 NOTE — Assessment & Plan Note (Addendum)
Likely viral pharyngitis but given Rx for antibiotic in case his symptoms worsen due to concern for his missing work.  Tessalon pearls for cough. Told him cough may persist for a few days.  Ibuprofen for sore throat. May also continue to take Tylenol as needed.

## 2011-05-23 NOTE — Progress Notes (Signed)
  Subjective:    Patient ID: Lonnie Holt, male    DOB: [birth year], 40 y.o.   MRN: [id number]  HPI This is a 40 YO M without any significant past medical history.   1. Sore throat Past several days.  Persistent.  2/3 children are sick. Took antibiotics and now improving.  Felt feverish at home although did not take temperatures. + chills.  Denies nausea currently but did have 2 episodes of vomiting.  Dry cough started yesterday.  Now able to tolerate liquids and foods although painful when swallowing.  Tried taking Tylenol for throat pain. Helps but persistent.  Did not go to work today.   Does not smoke.  Review of Systems Per HPI with inclusion of following:  Denies difficulty breathing, chest pain.     Objective:   Physical Exam Gen: slightly uncomfortable appearing Psych: pleasant Ears: TM clear Oropharynx: mild erythema and tonsillar adenopathy without exudates Neck: no LAD CV: RRR, no m/r/g Pulm: CTAB, no w/r/r Abd: NABS, soft, NT, ND Ext: dry, warm, no edema    Assessment & Plan:

## 2011-05-24 ENCOUNTER — Telehealth: Payer: Self-pay | Admitting: *Deleted

## 2011-05-24 NOTE — Telephone Encounter (Signed)
Olegario Messier from [facility] pharm called wanting to know if it would be ok to change the amoxicillin 875mg  to 500mg . Spoke with Dr. Deirdre Priest and he okayed this for 500mg  tid.Laureen Ochs, Viann Shove

## 2011-08-21 ENCOUNTER — Emergency Department (INDEPENDENT_AMBULATORY_CARE_PROVIDER_SITE_OTHER)
Admission: EM | Admit: 2011-08-21 | Discharge: 2011-08-21 | Disposition: A | Discharge status: Home / Self Care | Payer: Self-pay | Source: Home / Self Care | Attending: Family Medicine | Admitting: Family Medicine

## 2011-08-21 ENCOUNTER — Encounter (HOSPITAL_COMMUNITY): Payer: Self-pay | Admitting: Emergency Medicine

## 2011-08-21 DIAGNOSIS — J111 Influenza due to unidentified influenza virus with other respiratory manifestations: Secondary | ICD-10-CM

## 2011-08-21 MED ORDER — HYDROCOD POLST-CHLORPHEN POLST 10-8 MG/5ML PO LQCR: 5.0000 mL | Freq: Two times a day (BID) | ORAL | Status: DC

## 2011-08-21 MED ORDER — ONDANSETRON HCL 4 MG PO TABS: 4.0000 mg | ORAL_TABLET | Freq: Four times a day (QID) | ORAL | Status: AC

## 2011-08-21 NOTE — ED Provider Notes (Signed)
History     CSN: [id number] Arrival date & time: 08/21/2011  2:28 PM   First MD Initiated Contact with Patient 08/21/11 1309      Chief Complaint  Patient presents with  . Influenza    (Consider location/radiation/quality/duration/timing/severity/associated sxs/prior treatment) Patient is a 40 y.o. male presenting with flu symptoms. The history is provided by the patient.  Influenza This is a new problem. The current episode started 2 days ago. The problem has been gradually improving. Associated symptoms include abdominal pain. Pertinent negatives include no shortness of breath. Associated symptoms comments: N/v/d., fever , aches , wife and child with same.. The symptoms are aggravated by eating.    History reviewed. No pertinent past medical history.  History reviewed. No pertinent past surgical history.  No family history on file.  History  Substance Use Topics  . Smoking status: Never Smoker   . Smokeless tobacco: Not on file  . Alcohol Use: No      Review of Systems  Constitutional: Positive for fever, chills and appetite change.  HENT: Positive for congestion, rhinorrhea and postnasal drip.   Respiratory: Positive for cough. Negative for shortness of breath.   Cardiovascular: Negative.   Gastrointestinal: Positive for nausea, vomiting, abdominal pain and diarrhea.  Skin: Negative for rash.  Neurological: Negative.     Allergies  Review of patient's allergies indicates no known allergies.  Home Medications   Current Outpatient Rx  Name Route Sig Dispense Refill  . BENZONATATE 100 MG PO CAPS Oral Take 1 capsule (100 mg total) by mouth every 6 (six) hours as needed for cough. 30 capsule 0  . HYDROCOD POLST-CHLORPHEN POLST 10-8 MG/5ML PO LQCR Oral Take 5 mLs by mouth every 12 (twelve) hours. 115 mL 1  . ONDANSETRON HCL 4 MG PO TABS Oral Take 1 tablet (4 mg total) by mouth every 6 (six) hours. 6 tablet 0    BP 130/89  Pulse 93  Temp(Src) 98.9 F (37.2 C)  (Oral)  Resp 22  SpO2 97%  Physical Exam  Nursing note and vitals reviewed. Constitutional: He is oriented to person, place, and time. He appears well-developed and well-nourished.  HENT:  Head: Normocephalic.  Right Ear: External ear normal.  Left Ear: External ear normal.  Mouth/Throat: Oropharynx is clear and moist.  Eyes: Pupils are equal, round, and reactive to light.  Neck: Normal range of motion. Neck supple.  Cardiovascular: Normal rate, normal heart sounds and intact distal pulses.   Pulmonary/Chest: Effort normal and breath sounds normal.  Abdominal: Soft. Bowel sounds are normal. He exhibits no distension. There is tenderness in the epigastric area. There is no rigidity, no rebound, no guarding and no CVA tenderness.  Lymphadenopathy:    He has no cervical adenopathy.  Neurological: He is alert and oriented to person, place, and time.  Skin: Skin is warm and dry.    ED Course  Procedures (including critical care time)  Labs Reviewed - No data to display No results found.   1. Influenza       MDM          Barkley Bruns, MD 08/21/11 1531

## 2011-08-21 NOTE — ED Notes (Signed)
Pt here with family with flu like sx that started on Monday with cough,congestion,x 4 episodes of diarrhea and nausea.afebrile today.

## 2015-05-15 ENCOUNTER — Ambulatory Visit (INDEPENDENT_AMBULATORY_CARE_PROVIDER_SITE_OTHER): Payer: Self-pay | Admitting: Emergency Medicine

## 2015-05-15 VITALS — BP 118/60 | HR 77 | Temp 98.1°F | Resp 16 | Ht 65.75 in | Wt 168.0 lb

## 2015-05-15 DIAGNOSIS — H5712 Ocular pain, left eye: Secondary | ICD-10-CM

## 2015-05-15 MED ORDER — POLYMYXIN B-TRIMETHOPRIM 10000-0.1 UNIT/ML-% OP SOLN: 2.0000 [drp] | OPHTHALMIC | Status: DC

## 2015-05-15 NOTE — Patient Instructions (Signed)

## 2015-05-15 NOTE — Progress Notes (Signed)
Subjective:  Patient ID: Lonnie Holt, male    DOB: [birth year]  Age: 44 y.o. MRN: [id number]  CC: Eye Problem   HPI Darrell Hauk presents  with pain in his left eye over the last week. He works as a Patent attorney. Has no history of foreign body in his eye. Denies any photophobia or blurred vision. Has had no improvement with over-the-counter medication. Said that his eye is red and has no nasal congestion postnasal drainage and pain in his ears area no fever or chills. His eye has become red. Is no foreign body sensation  History Shaman has no past medical history on file.   He has no past surgical history on file.   His  family history is not on file.  He   reports that he has never smoked. He does not have any smokeless tobacco history on file. He reports that he does not drink alcohol or use illicit drugs.  Outpatient Prescriptions Prior to Visit  Medication Sig Dispense Refill  . chlorpheniramine-HYDROcodone (TUSSIONEX PENNKINETIC ER) 10-8 MG/5ML LQCR Take 5 mLs by mouth every 12 (twelve) hours. (Patient not taking: Reported on 05/15/2015) 115 mL 1   No facility-administered medications prior to visit.    Social History   Social History  . Marital Status: Married    Spouse Name: N/A  . Number of Children: N/A  . Years of Education: N/A   Social History Main Topics  . Smoking status: Never Smoker   . Smokeless tobacco: None  . Alcohol Use: No  . Drug Use: No  . Sexual Activity: Not Asked   Other Topics Concern  . None   Social History Narrative     Review of Systems  Constitutional: Negative for fever, chills and appetite change.  HENT: Negative for congestion, ear pain, postnasal drip, sinus pressure and sore throat.   Eyes: Positive for pain and redness. Negative for photophobia, discharge and visual disturbance.  Respiratory: Negative for cough, shortness of breath and wheezing.   Cardiovascular: Negative for leg swelling.    Gastrointestinal: Negative for nausea, vomiting, abdominal pain, diarrhea, constipation and blood in stool.  Endocrine: Negative for polyuria.  Genitourinary: Negative for dysuria, urgency, frequency and flank pain.  Musculoskeletal: Negative for gait problem.  Skin: Negative for rash.  Neurological: Negative for weakness and headaches.  Psychiatric/Behavioral: Negative for confusion and decreased concentration. The patient is not nervous/anxious.     Objective:  BP 118/60 mmHg  Pulse 77  Temp(Src) 98.1 F (36.7 C) (Oral)  Resp 16  Ht 5' 5.75" (1.67 m)  Wt 168 lb (76.204 kg)  BMI 27.32 kg/m2  SpO2 98%  Physical Exam  Constitutional: He is oriented to person, place, and time. He appears well-developed and well-nourished. No distress.  HENT:  Head: Normocephalic and atraumatic.  Right Ear: External ear normal.  Left Ear: External ear normal.  Nose: Nose normal.  Eyes: Conjunctivae and EOM are normal. Pupils are equal, round, and reactive to light. Lids are everted and swept, no foreign bodies found. Right conjunctiva is not injected. Right conjunctiva has no hemorrhage. Left conjunctiva is not injected. Left conjunctiva has no hemorrhage. No scleral icterus. Right pupil is round and reactive. Left pupil is round and reactive. Pupils are equal.  No fluorescein uptake.   Neck: Normal range of motion. Neck supple. No tracheal deviation present.  Cardiovascular: Normal rate, regular rhythm and normal heart sounds.   Pulmonary/Chest: Effort normal. No respiratory distress. He has no wheezes. He  has no rales.  Abdominal: He exhibits no mass. There is no tenderness. There is no rebound and no guarding.  Musculoskeletal: He exhibits no edema.  Lymphadenopathy:    He has no cervical adenopathy.  Neurological: He is alert and oriented to person, place, and time. Coordination normal.  Skin: Skin is warm and dry. No rash noted.  Psychiatric: He has a normal mood and affect. His behavior is  normal.      Assessment & Plan:   Shaman was seen today for eye problem.  Diagnoses and all orders for this visit:  Eye pain, left -     Ambulatory referral to Ophthalmology  Other orders -     trimethoprim-polymyxin b (POLYTRIM) ophthalmic solution; Place 2 drops into the left eye every 4 (four) hours.   I have discontinued Mr. Millan-Gomez's chlorpheniramine-HYDROcodone. I am also having him start on trimethoprim-polymyxin b.  Meds ordered this encounter  Medications  . trimethoprim-polymyxin b (POLYTRIM) ophthalmic solution    Sig: Place 2 drops into the left eye every 4 (four) hours.    Dispense:  10 mL    Refill:  0    Appropriate red flag conditions were discussed with the patient as well as actions that should be taken.  Patient expressed his understanding.  Follow-up: Return if symptoms worsen or fail to improve.  Carmelina Dane, MD

## 2015-05-16 ENCOUNTER — Encounter: Payer: Self-pay | Admitting: Family Medicine

## 2015-05-16 ENCOUNTER — Ambulatory Visit (INDEPENDENT_AMBULATORY_CARE_PROVIDER_SITE_OTHER): Payer: Self-pay | Admitting: Family Medicine

## 2015-05-16 VITALS — BP 132/74 | HR 81 | Temp 98.1°F | Resp 16 | Ht 66.0 in | Wt 169.0 lb

## 2015-05-16 DIAGNOSIS — H578 Other specified disorders of eye and adnexa: Secondary | ICD-10-CM

## 2015-05-16 DIAGNOSIS — H5789 Other specified disorders of eye and adnexa: Secondary | ICD-10-CM | POA: Insufficient documentation

## 2015-05-16 DIAGNOSIS — H538 Other visual disturbances: Secondary | ICD-10-CM

## 2015-05-16 DIAGNOSIS — H5712 Ocular pain, left eye: Secondary | ICD-10-CM

## 2015-05-16 NOTE — Assessment & Plan Note (Signed)
7 day history of painful red eye, blurry vision x 4 days. Possibility of foreign body with exposure at work but negative fluorescein exam yesterday and no sensation of foreign object. Visual acuity decreased on the left, no prior to compare to but is considerably worse than the right. Concern for acute glaucoma which needs to be ruled out. Called and got stat appt for ophthalmology at [facility] eye care, appreciate their timeliness in getting appt before closing hours.

## 2015-05-16 NOTE — Progress Notes (Signed)
C/C Lt eye red loss vision  Possible sand on eyes  Using Rx Polymyxin B sulfate and Trimethoprim  Prescribed at [facility]

## 2015-05-16 NOTE — Progress Notes (Signed)
   Subjective:    Patient ID: Lonnie Holt, male    DOB: 1971-07-31, 44 y.o.   MRN: [id number]  HPI  [employer] video interpreter - Ricki Rodriguez [id number]  Patient presents for Same Day Appointment  CC: red eye  # Red Left eye:  Seen at [facility] yesterday, given antibiotic eye drops  Says red eye started about 7 days ago, also painful. Pain improved yesterday mostly but still has some pain with being outside in the sun and occasional sharp pain on the outside of his eye  Says he has had blurry vision for about 4 days (does not wear glasses/contacts)  Temples are nontender  No headaches  Works as a Patent attorney. Denies any objects getting in eye, denies feeling of foreign body or scratchy eye  Had fluorescein eye exam yesterday at [place] ROS: no fevers/chills, nausea/vomiting  Social Hx: never smoker  Review of Systems   See HPI for ROS. All other systems reviewed and are negative.  Past medical history, surgical, family, and social history reviewed and updated in the EMR as appropriate.  Objective:  BP 132/74 mmHg  Pulse 81  Temp(Src) 98.1 F (36.7 C) (Oral)  Resp 16  Ht  (1.676 m)  Wt 169 lb (76.658 kg)  BMI 27.29 kg/m2 Vitals and nursing note reviewed  General: NAD Eyes: left conjunctiva red, very small amount of dried crust on medial aspect of eye. No foreign bodies observed. EOMI and PERRL. Fundoscopic exam limited by non-dilated pupils and aversion to light, on the right had normal appearance of vessels and optic disc but unable to visualize optic disc on the left due to difficulty with exam. Head: nontender temples bilaterally Neuro: alert and oriented.  Visual acuity: R 20/16, L 20/80   Assessment & Plan:  Redness of eye, left 7 day history of painful red eye, blurry vision x 4 days. Possibility of foreign body with exposure at work but negative fluorescein exam yesterday and no sensation of foreign object. Visual acuity decreased on the  left, no prior to compare to but is considerably worse than the right. Concern for acute glaucoma which needs to be ruled out. Called and got stat appt for ophthalmology at [facility] eye care, appreciate their timeliness in getting appt before closing hours.

## 2019-05-20 ENCOUNTER — Emergency Department (HOSPITAL_COMMUNITY)
Admission: EM | Admit: 2019-05-20 | Discharge: 2019-05-20 | Disposition: A | Discharge status: Home / Self Care | Payer: No Typology Code available for payment source | Attending: Emergency Medicine | Admitting: Emergency Medicine

## 2019-05-20 ENCOUNTER — Emergency Department (HOSPITAL_COMMUNITY): Payer: No Typology Code available for payment source

## 2019-05-20 DIAGNOSIS — R109 Unspecified abdominal pain: Secondary | ICD-10-CM | POA: Diagnosis not present

## 2019-05-20 DIAGNOSIS — R0789 Other chest pain: Secondary | ICD-10-CM | POA: Diagnosis present

## 2019-05-20 DIAGNOSIS — M542 Cervicalgia: Secondary | ICD-10-CM | POA: Insufficient documentation

## 2019-05-20 DIAGNOSIS — Y93I9 Activity, other involving external motion: Secondary | ICD-10-CM | POA: Diagnosis not present

## 2019-05-20 DIAGNOSIS — R51 Headache: Secondary | ICD-10-CM | POA: Insufficient documentation

## 2019-05-20 DIAGNOSIS — Y92410 Unspecified street and highway as the place of occurrence of the external cause: Secondary | ICD-10-CM | POA: Insufficient documentation

## 2019-05-20 DIAGNOSIS — Y999 Unspecified external cause status: Secondary | ICD-10-CM | POA: Diagnosis not present

## 2019-05-20 LAB — BASIC METABOLIC PANEL
Anion gap: 8 (ref 5–15)
BUN: 16 mg/dL (ref 6–20)
CO2: 24 mmol/L (ref 22–32)
Calcium: 8.5 mg/dL — ABNORMAL LOW (ref 8.9–10.3)
Chloride: 107 mmol/L (ref 98–111)
Creatinine, Ser: 0.61 mg/dL (ref 0.61–1.24)
GFR calc Af Amer: >60 mL/min (ref >60)
GFR calc non Af Amer: >60 mL/min (ref >60)
Glucose, Bld: 96 mg/dL (ref 70–99)
Potassium: 3.8 mmol/L (ref 3.5–5.1)
Sodium: 139 mmol/L (ref 135–145)

## 2019-05-20 LAB — CBC
HCT: 42.1 % (ref 39.0–52.0)
Hemoglobin: 14.1 g/dL (ref 13.0–17.0)
MCH: 30.3 pg (ref 26.0–34.0)
MCHC: 33.5 g/dL (ref 30.0–36.0)
MCV: 90.5 fL (ref 80.0–100.0)
Platelets: 334 10*3/uL (ref 150–400)
RBC: 4.65 MIL/uL (ref 4.22–5.81)
RDW: 12.5 % (ref 11.5–15.5)
WBC: 7.6 10*3/uL (ref 4.0–10.5)
nRBC: 0 % (ref 0.0–0.2)

## 2019-05-20 MED ORDER — FENTANYL CITRATE (PF) 100 MCG/2ML IJ SOLN
50.0000 ug | Freq: Once | INTRAMUSCULAR | Status: AC
  Administered 2019-05-20: 20:00:00 50 ug via INTRAVENOUS
  Filled 2019-05-20: qty 2

## 2019-05-20 MED ORDER — SODIUM CHLORIDE 0.9 % IV BOLUS
500.0000 mL | Freq: Once | INTRAVENOUS | Status: AC
  Administered 2019-05-20: 500 mL via INTRAVENOUS

## 2019-05-20 MED ORDER — IOHEXOL 300 MG/ML  SOLN
100.0000 mL | Freq: Once | INTRAMUSCULAR | Status: AC | PRN
  Administered 2019-05-20: 21:00:00 100 mL via INTRAVENOUS
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
## 2019-05-20 NOTE — ED Notes (Signed)
Patient verbalizes understanding of discharge instructions. Opportunity for questioning and answers were provided. Armband removed by staff, pt discharged from ED ambulatory.   

## 2019-05-20 NOTE — Discharge Instructions (Addendum)
Use ibuprofen for pain. Return if worsening symptoms. Recheck of your blood pressure as an outpatient

## 2020-11-01 ENCOUNTER — Ambulatory Visit: Payer: Self-pay | Admitting: Physician Assistant

## 2020-11-01 ENCOUNTER — Other Ambulatory Visit: Payer: Self-pay

## 2020-11-01 VITALS — BP 125/88 | HR 86 | Temp 98.2°F | Resp 18 | Ht 67.0 in | Wt 168.0 lb

## 2020-11-01 DIAGNOSIS — M5441 Lumbago with sciatica, right side: Secondary | ICD-10-CM

## 2020-11-01 MED ORDER — KETOROLAC TROMETHAMINE 60 MG/2ML IM SOLN
60.0000 mg | Freq: Once | INTRAMUSCULAR | Status: AC
  Administered 2020-11-01: 60 mg via INTRAMUSCULAR

## 2020-11-01 MED ORDER — METHYLPREDNISOLONE ACETATE 80 MG/ML IJ SUSP
80.0000 mg | Freq: Once | INTRAMUSCULAR | Status: AC
  Administered 2020-11-01: 80 mg via INTRAMUSCULAR

## 2020-11-01 NOTE — Progress Notes (Signed)
Patient verified DOB Patient reports back pain on the right side radiating down to bottom of his foot. Patient shares pain is now occurring in the left upper part of the back. Patient has been taking a spanish medication that is b complex with diclofenac

## 2020-11-01 NOTE — Patient Instructions (Signed)
[facility] next week for fasting labs.  Continue using Advil over the counter to help with your low back pain , ice and gentle stretching can also be very helpful  Roney Jaffeari S. Mayers, PA-C Physician Assistant [facility] [email or web address]     Citica Sciatica  La citica es el dolor, entumecimiento, debilidad u hormigueo a lo largo del nervio citico. El nervio citico comienza en la parte inferior de la espalda y desciende por la parte posterior de cada pierna. Controla los msculos en la parte inferior de las piernas y en la parte posterior de las rodillas. Tambin proporciona sensibilidad a la parte posterior de los muslos, la parte inferior de las piernas y la planta de los pies. La citica es un sntoma de otra afeccin que ejerce presin o "pellizca" el nervio citico. Con mayor frecuencia, la citica afecta a un solo lado del cuerpo. Suele desaparecer por s sola o con tratamiento. En algunos casos, la Hydrologistcitica puede volver (ser recurrente). Cules son las causas? Esta afeccin es causada por una presin sobre el nervio citico o "pellizco" del nervio citico. Esto puede ser el resultado de:  Un disco que sobresale demasiado entre los huesos de la columna vertebral (hernia de disco).  Cambios en los discos vertebrales relacionados con la edad.  Un trastorno doloroso que afecta un msculo de las nalgas.  Un crecimiento seo adicional cerca del nervio citico.  Una rotura (fractura) de la pelvis.  Embarazo.  Tumor. Esto es poco frecuente. Qu incrementa el riesgo? Los siguientes factores pueden hacer que sea ms propenso a Aeronautical engineerdesarrollar esta afeccin:  Microbiologistracticar deportes que ponen presin o tensin sobre la columna vertebral.  Tener poca fuerza y flexibilidad.  Antecedentes de ciruga o lesin en la espalda.  Estar sentado durante largos perodos de  Trentontiempo.  Realizar actividades que requieren agacharse o levantar objetos en forma repetida.  Obesidad. Cules son los signos o los sntomas? Los sntomas pueden ser leves o graves, y pueden incluir los siguientes:  Cualquiera de los siguientes problemas en la parte inferior de la espalda, piernas, cadera o nalgas: ? Hormigueo leve, adormecimiento o dolor sordo. ? Sensacin de ardor. ? Dolor agudo.  Adormecimiento de la parte posterior de la pantorrilla o la planta del pie.  Debilidad en las piernas.  Dolor de espalda intenso que dificulta el movimiento. Los sntomas podran empeorar al toser, Engineering geologistestornudar o rerse, o cuando se est sentado o de pie durante perodos prolongados. Cmo se diagnostica? Esta afeccin se puede diagnosticar en funcin de lo siguiente:  Los sntomas y los antecedentes mdicos.  Un examen fsico.  Anlisis de sangre.  Pruebas de diagnstico por imgenes, por ejemplo: ? Radiografas. ? Resonancia magntica (RM). ? Exploracin por tomografa computarizada (TC). Cmo se trata? En muchos casos, esta afeccin mejora por s sola, sin tratamiento. Sin embargo, el tratamiento puede incluir:  Reduccin o modificacin la actividad fsica.  Ejercicios y estiramientos.  Aplicacin de calor o hielo en la zona afectada.  Medicamentos para lo siguiente: ? Aliviar el dolor y la inflamacin. ? Relajar los msculos.  Medicamentos inyectables que ayudan a Engineer, materialsaliviar el dolor, la irritacin y la inflamacin alrededor del nervio citico (corticoesteroides).  Ciruga. Siga estas instrucciones en su casa: Medicamentos  Tome los medicamentos de venta libre y los recetados solamente como se lo haya indicado el mdico.  Pregntele al mdico si el medicamento recetado: ? Hace que sea necesario que evite conducir o  usar maquinaria pesada. ? Puede causarle estreimiento. Es posible que tenga que tomar estas medidas para prevenir o tratar el estreimiento:  Product manager  suficiente lquido como para Pharmacologist la orina de color amarillo plido.  Tomar medicamentos recetados o de [employer].  Consumir alimentos ricos en fibra, como frijoles, cereales integrales, y frutas y verduras frescas.  Limitar el consumo de alimentos ricos en grasa y azcares procesados, como los alimentos fritos o dulces. Control del dolor  Si se lo indican, aplique hielo sobre la zona afectada. ? Ponga el hielo en una bolsa plstica. ? Coloque una [employer] piel y Copy. ? Coloque el hielo durante , 2 a 3veces por da.  Si se lo indican, aplique calor en la zona afectada. Use la fuente de calor que el mdico le recomiende, como una compresa de calor hmedo o una almohadilla trmica. ? Coloque una [employer] piel y la fuente de Airline pilot. ? Aplique calor durante 20 a . ? Retire la fuente de calor si la piel se pone de color rojo brillante. Esto es especialmente importante si no puede sentir dolor, calor o fro. Puede correr un riesgo mayor de sufrir quemaduras.      Actividad  Retome sus actividades normales segn lo indicado por el mdico. Pregntele al mdico qu actividades son seguras para usted.  Evite las [employer] sntomas.  Durante el da, descanse durante lapsos breves. ? Cuando descanse durante perodos ms largos, incorpore alguna [place] o ejercicios de [employer] perodos de descanso. Esto ayudar a Transport planner rigidez y Chief Technology Officer. ? Evite estar sentado durante largos perodos de tiempo sin moverse. Levntese y [place] al menos una vez cada hora.  Haga ejercicio y elongue habitualmente, como se lo haya indicado el mdico.  No levante nada que pese ms de 10libras (4.5kg) mientras tenga sntomas de citica. Aunque no tenga sntomas, evite levantar objetos pesados, en especial en forma repetida.  Siempre use las tcnicas de levantamiento correctas para levantar objetos, entre ellas: ? Flexionar las  rodillas. ? Mantener la carga cerca del cuerpo. ? No torcerse.   Instrucciones generales  Mantenga un peso saludable. El exceso de peso ejerce tensin adicional sobre la espalda.  Use calzado con buen apoyo y cmodo. Evite usar tacones.  Evite dormir sobre un colchn que sea demasiado blando o demasiado duro. Un colchn que ofrezca un apoyo suficientemente firme para su espalda al dormir puede ayudar a Engineer, materials.  Concurra a todas las visitas de [address] se lo haya indicado el mdico. Esto es importante. Comunquese con un mdico si:  Tiene un dolor con estas caractersticas: ? Lo despierta cuando est dormido. ? Empeora al estar recostado. ? Es Teacher, music del que experiment en el pasado. ? Dura ms de 4semanas.  Pierde peso de [place] inexplicable. Solicite ayuda inmediatamente si:  No puede controlar cundo Automotive engineer (incontinencia).  Tiene lo siguiente: ? Debilidad que empeora en la parte inferior de la espalda, la pelvis, las nalgas o las piernas. ? Enrojecimiento o inflamacin en la espalda. ? Sensacin de ardor al [employer]. Resumen  La citica es Chief Technology Officer, entumecimiento, debilidad u hormigueo a lo largo del nervio citico.  Esta afeccin es causada por una presin sobre el nervio citico o "pellizco" del nervio citico.  La citica puede Programmer, multimedia, adormecimiento u hormigueo en la parte inferior de la espalda, las piernas, las caderas y las nalgas.  El tratamiento a menudo incluye reposo, ejercicios,  medicamentos y Engineer, manufacturing systems. Esta informacin no tiene Theme park manager el consejo del mdico. Asegrese de hacerle al mdico cualquier pregunta que tenga. Document Revised: 10/14/2018 Document Reviewed: 10/14/2018 Elsevier Patient Education  2021 Elsevier Inc.   Rehabilitacin para la citica Sciatica Rehab Pregunte al mdico qu ejercicios son seguros para usted. Haga los ejercicios exactamente como se lo haya indicado el mdico y  gradelos como se lo hayan indicado. Es normal sentir un estiramiento leve, tironeo, opresin o Dentist al [employer] ejercicios. Detngase de inmediato si siente un dolor repentino o Community education officer. No comience a hacer estos ejercicios hasta que se lo indique el mdico. Ejercicios de elongacin y amplitud de movimiento Estos ejercicios calientan los msculos y las articulaciones, y mejoran el movimiento y la flexibilidad de la cadera y la espalda. Estos ejercicios tambin ayudan a Engineer, materials, el adormecimiento y el hormigueo. Deslizamiento del nervio citico 1. Sintese en una silla con la cabeza hacia abajo en direccin al pecho. Coloque las manos detrs de la espalda. Deje caer los hombros hacia adelante. 2. Extienda lentamente una de las piernas mientras inclina la cabeza hacia atrs como si estuviera mirando hacia el techo. Solo extienda la pierna tan lejos como pueda sin empeorar los sntomas. 3. Mantenga esta posicin durante __________ segundos. 4. Vuelva lentamente a la posicin inicial. 5. Repita el ejercicio con la otra pierna. Repita __________ veces. Realice este ejercicio __________ veces al da. Rodilla al pecho con aduccin de cadera y rotacin interna 1. Acustese boca arriba en una superficie firme con las piernas extendidas. 2. Flexione una rodilla y llvela hacia el pecho hasta que sienta un estiramiento suave en la parte inferior de la espalda y las nalgas. A continuacin, mueva la rodilla hacia el hombro que est en el lado contrario de la pierna. Esto se denomina rotacin interna y aduccin de la cadera. ? Mantenga la pierna en esta posicin tomando la parte frontal de la rodilla. 3. Mantenga esta posicin durante __________ segundos. 4. Vuelva lentamente a la posicin inicial. 5. Repita el ejercicio con la otra pierna. Repita __________ veces. Realice este ejercicio __________ veces al da.   Extensin [employer] codos, en decbito prono 1. Acustese boca abajo  sobre una superficie firme. La cama puede ser demasiado blanda para este ejercicio. 2. Apyese sobre los codos. 3. Con los brazos, aydese a Haematologist sentir un leve estiramiento en el abdomen y la parte inferior de la espalda. ? Arts administrator de [employer] codos. Si no se siente cmodo, intente colocando almohadas debajo del pecho. ? Debe dejar la cadera inmvil sobre la superficie en la que est apoyado. Mantenga la cadera y los msculos de la espalda relajados. 4. Mantenga esta posicin durante __________ segundos. 5. Afloje lentamente la parte superior del cuerpo y vuelva a la posicin inicial. Repita __________ veces. Realice este ejercicio __________ veces al da.   Ejercicios de fortalecimiento Estos ejercicios fortalecen la espalda y le otorgan resistencia. La resistencia es la capacidad de usar los msculos durante un tiempo prolongado, incluso despus de que se cansen. Inclinacin de la pelvis Este ejercicio fortalece los msculos que se encuentran en la parte profunda del abdomen. 1. Acustese boca arriba sobre una superficie firme. Doble las rodillas y [employer] pies planos sobre el piso. 2. Tensione los msculos abdominales. Eleve la pelvis hacia el techo y aplane la parte inferior de la espalda contra el suelo. ? Para realizar este ejercicio, puede  colocar una toalla pequea debajo de la parte inferior de la espalda y presionar la espalda contra la toalla. 3. Mantenga esta posicin durante __________ segundos. 4. Relaje totalmente los msculos antes de repetir el ejercicio. Repita __________ veces. Realice este ejercicio __________ veces al da. Elevaciones alternadas de pierna y brazo 1. Apoye las palmas de las manos y las rodillas sobre una superficie firme. Si est sobre un suelo duro, puede usar un elemento acolchado, como una alfombrilla para ejercicios, para apoyar las rodillas. 2. Alinee los brazos y las piernas. Las manos deben estar justo  debajo de los hombros, y las rodillas debajo de la cadera. 3. Eleve la pierna izquierda hacia atrs. Al mismo tiempo, eleve el brazo derecho y Associate Professor frente a usted. ? No eleve la pierna por encima de la cadera. ? No eleve el brazo por encima del hombro. ? Mantenga los msculos del abdomen y de la espalda contrados. ? Mantenga la cadera [employer] el suelo. ? No arquee la espalda. ? Mantenga el equilibrio con cuidado y no contenga la respiracin. 4. Mantenga esta posicin durante __________ segundos. 5. Vuelva lentamente a la posicin inicial. 6. Repita con su pierna derecha y su brazo izquierdo. Repita __________ veces. Realice este ejercicio __________ veces al da.   Postura y [place] corporal La buena postura y la Administrator, sports corporal saludable pueden ayudar a Acupuncturist estrs en las articulaciones y los tejidos del cuerpo. La Water quality scientist se refiere a los movimientos y a las posiciones del cuerpo mientras realiza las actividades diarias. La postura es una parte de la Water quality scientist. Neomia Dear buena postura significa que:  La columna est en su posicin natural de curvatura en S (neutral).  Los hombros estn [employer].  La cabeza no est inclinada hacia adelante. Siga esas pautas para mejorar la postura y Regulatory affairs officer en sus actividades diarias. De pie  Al estar de pie, mantenga la columna en la posicin neutral y los pies separados al ancho de caderas, aproximadamente. Mantenga las rodillas ligeramente flexionadas. Las [place], los hombros y las caderas deben estar alineados.  Cuando realice una tarea en la que deba estar de pie en el mismo sitio durante mucho tiempo, coloque un pie en un objeto estable de 2 a 4pulgadas (5a 10cm) de alto, como un taburete. Esto ayuda a que la columna mantenga una posicin neutral.   Sentado  Cuando est sentado, mantenga la columna en posicin neutral y deje los pies apoyados en el suelo. Use un apoyapis, si es  necesario, y [employer] muslos paralelos al suelo. Evite redondear los hombros e inclinar la cabeza hacia adelante.  Cuando trabaje en un escritorio o con una computadora, el escritorio debe estar a una altura en la que las manos estn un poco ms abajo que los codos. Deslice la silla debajo del escritorio, de modo de estar lo suficientemente cerca como para mantener una buena [employer].  Cuando trabaje con una computadora, coloque el monitor a una altura que le permita mirar derecho hacia adelante, sin tener que inclinar la cabeza hacia adelante o [place] atrs.   Reposo  Al descansar o estar acostado, evite las posiciones que le causen ms dolor.  Si siente dolor al [employer] que exigen sentarse, inclinarse, agacharse o ponerse en cuclillas, acustese en una posicin en la que el cuerpo no deba doblarse mucho. Por ejemplo, evite acurrucarse de costado con los brazos y las rodillas cerca del pecho (posicin fetal).  Si siente dolor con las [address]  que exigen estar de pie durante mucho tiempo o estirar los brazos, acustese con la columna en una posicin neutral y flexione ligeramente las rodillas. Pruebe con las siguientes posiciones: ? Teacher, music de costado con una almohada entre las rodillas. ? Acostarse boca arriba con una almohada debajo de las rodillas. Levantar objetos  Cuando tenga que levantar un objeto, mantenga los pies separados el ancho de los hombros, como [place], y apriete los msculos abdominales.  Flexione las rodillas y la cadera, y Dietitian la columna en posicin neutral. Es importante levantarse utilizando la fuerza de las piernas, no de la espalda. No trabe las rodillas hacia afuera.  Siempre pida ayuda a otra persona para levantar objetos pesados o incmodos.   Esta informacin no tiene Theme park manager el consejo del mdico. Asegrese de hacerle al mdico cualquier pregunta que tenga. Document Revised: 10/14/2018 Document Reviewed: 10/14/2018 Elsevier Patient  Education  2021 [employer].
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
## 2020-11-08 ENCOUNTER — Other Ambulatory Visit: Payer: Self-pay

## 2020-11-08 VITALS — BP 127/90 | HR 66 | Temp 98.2°F | Resp 18 | Ht 67.0 in | Wt 168.0 lb

## 2020-11-08 DIAGNOSIS — B351 Tinea unguium: Secondary | ICD-10-CM

## 2020-11-08 DIAGNOSIS — Z114 Encounter for screening for human immunodeficiency virus [HIV]: Secondary | ICD-10-CM

## 2020-11-08 DIAGNOSIS — Z23 Encounter for immunization: Secondary | ICD-10-CM

## 2020-11-08 DIAGNOSIS — Z13228 Encounter for screening for other metabolic disorders: Secondary | ICD-10-CM

## 2020-11-08 DIAGNOSIS — Z1159 Encounter for screening for other viral diseases: Secondary | ICD-10-CM

## 2020-11-08 NOTE — Patient Instructions (Signed)
Infeccin por hongos en las uas Fungal Nail Infection La infeccin por hongos en las uas es una infeccin frecuente de las uas de los pies o de las manos. Este trastorno [employer] uas de los pies con ms frecuencia que las uas de las manos. Generalmente afecta al dedo gordo del pie. Ms de Neomia Dear ua puede infectarse. Esta afeccin puede transmitirse de [place] persona a otra (es contagiosa). Cules son las causas? La causa de esta afeccin es un hongo. Son varios los tipos de hongos que pueden causar la infeccin. Estos hongos son frecuentes en las zonas hmedas y clidas. Si las manos o los pies entran en contacto con los hongos, se pueden introducir en una ruptura de las uas de las manos o de los pies y Games developer infeccin. Qu incrementa el riesgo? Los siguientes factores pueden hacer que usted sea ms propenso a Aeronautical engineer afeccin:  Ser hombre.  Ser Neomia Dear persona de edad avanzada.  Convivir con alguien que tiene hongos.  Caminar descalzo en zonas donde proliferan hongos, como duchas o vestuarios.  Usar zapatos y calcetines que [employer].  Tener una ua lastimada o haberse sometido a una ciruga de uas recientemente.  Tener ciertas afecciones, por ejemplo: ? Pie de atleta. ? Diabetes. ? Psoriasis. ? Mala circulacin. ? Debilitamiento del sistema de defensa del organismo (sistema inmunitario). Cules son los signos o los sntomas? Los sntomas de esta afeccin incluyen:  Una mancha plida sobre la ua.  Engrosamiento de la ua.  Una ua que se torna amarilla o [place].  Bordes de las uas rugosos o quebradizos.  Una ua que se cae.  Una ua que se ha desprendido del lecho ungueal.   Cmo se diagnostica? Esta afeccin se diagnostica mediante un examen fsico. El mdico podr tomar una muestra de la ua para examinarla y Engineer, manufacturing si tiene hongos. Cmo se trata? No es necesario realizar tratamiento si la infeccin es leve. Si tiene [employer] uas, el tratamiento puede incluir lo siguiente:  Antimicticos que se toman por boca (va oral). Deber tomar los medicamentos durante algunas semanas o meses y no ver los resultados hasta despus de un [place]. Estos medicamentos pueden tener efectos secundarios. Consulte al [employer] a los que debe estar atento.  Cremas o esmaltes de uas antimicticos. Se pueden usar junto con los medicamentos antimicticos que se administran por va oral.  Tratamiento lser de las uas.  Ciruga para extirpar la ua. Esto puede ser [employer] casos ms graves de infecciones. La infeccin puede tardar un largo tiempo en desaparecer, habitualmente hasta un ao. Adems, la infeccin puede regresar.   Siga estas indicaciones en su casa: Medicamentos  Tome o aplquese los medicamentos de venta libre y los recetados solamente como se lo haya indicado el mdico.  Consulte al mdico sobre el uso de pomadas mentoladas para las uas de [place]. Cuidado de las uas  Crtese las uas con Psychologist, clinical.  Lvese y squese las manos y los pies todos [place].  Mantenga los pies secos: ? Use calcetines absorbentes y cmbiese los calcetines con frecuencia. ? Use un tipo de calzado que permita que el aire [place], como sandalias o zapatillas de lona. Deseche los zapatos viejos.  No use uas artificiales.  Si va al saln de esttica de uas, asegrese de elegir uno en el que se usen instrumentos limpios.  Aplquese polvo antimictico en los pies y en los zapatos. Indicaciones generales  No comparta elementos personales como toallas o cortauas.  No camine descalzo en duchas o vestuarios.  Use guantes de goma si est trabajando con sus manos en lugares mojados.  Concurra a todas las visitas de control como se lo haya indicado el mdico. Esto es importante. Comunquese con un mdico si: La infeccin no mejora o si empeora despus de varios meses. Resumen  La infeccin  por hongos en las uas es una infeccin frecuente de las uas de los pies o de las manos.  No es necesario realizar tratamiento si la infeccin es leve. Si tiene cambios importantes en las uas, el tratamiento puede incluir la administracin de medicamentos por va oral y la aplicacin de un medicamento en las uas.  La infeccin puede tardar un largo tiempo en desaparecer, habitualmente hasta un ao. Adems, la infeccin puede regresar.  Tome o aplquese los medicamentos de venta libre y los recetados solamente como se lo haya indicado el mdico.  Siga las instrucciones de cuidado de las uas a fin de ayudar a evitar que la infeccin regrese o se extienda. Esta informacin no tiene como fin reemplazar el consejo del mdico. Asegrese de hacerle al mdico cualquier pregunta que tenga. Document Revised: 02/26/2018 Document Reviewed: 02/26/2018 Elsevier Patient Education  2021 Elsevier Inc.  

## 2020-11-08 NOTE — Progress Notes (Signed)
Established Patient Office Visit  Subjective:  Patient ID: Lonnie Holt, male    DOB: 1971-09-01  Age: 50 y.o. MRN: [id number]  CC:  Chief Complaint  Patient presents with  . Nail Problem    HPI Lonnie Holt presents for fasting labs, states that he has a new complaint of thickened darkened toenails.  States they have been present over the past year.  Reports that he has been using over-the-counter fungal cream with some relief.  No other concerns at this time  Due to language barrier, an interpreter was present during the history-taking and subsequent discussion (and for part of the physical exam) with this patient.   History reviewed. No pertinent past medical history.  History reviewed. No pertinent surgical history.  History reviewed. No pertinent family history.  Social History   Socioeconomic History  . Marital status: Married    Spouse name: Not on file  . Number of children: Not on file  . Years of education: Not on file  . Highest education level: Not on file  Occupational History  . Not on file  Tobacco Use  . Smoking status: Never Smoker  . Smokeless tobacco: Never Used  Substance and Sexual Activity  . Alcohol use: No  . Drug use: No  . Sexual activity: Not on file  Other Topics Concern  . Not on file  Social History Narrative  . Not on file   Social Determinants of Health   Financial Resource Strain: Not on file  Food Insecurity: Not on file  Transportation Needs: Not on file  Physical Activity: Not on file  Stress: Not on file  Social Connections: Not on file  Intimate Partner Violence: Not on file    No outpatient medications prior to visit.   No facility-administered medications prior to visit.    No Known Allergies  ROS Review of Systems  Constitutional: Negative.   HENT: Negative.   Eyes: Negative.   Respiratory: Negative.   Cardiovascular: Negative.   Gastrointestinal: Negative.   Endocrine: Negative.    Genitourinary: Negative.   Musculoskeletal: Negative.   Skin: Negative.   Allergic/Immunologic: Negative.   Neurological: Negative.   Hematological: Negative.       Objective:    Physical Exam Vitals and nursing note reviewed.  Constitutional:      Appearance: Normal appearance.  HENT:     Head: Normocephalic.     Right Ear: External ear normal.     Left Ear: External ear normal.     Nose: Nose normal.     Mouth/Throat:     Mouth: Mucous membranes are moist.     Pharynx: Oropharynx is clear.  Eyes:     Extraocular Movements: Extraocular movements intact.     Conjunctiva/sclera: Conjunctivae normal.     Pupils: Pupils are equal, round, and reactive to light.  Cardiovascular:     Rate and Rhythm: Normal rate and regular rhythm.     Pulses: Normal pulses.     Heart sounds: Normal heart sounds.  Pulmonary:     Effort: Pulmonary effort is normal.     Breath sounds: Normal breath sounds.  Musculoskeletal:        General: Normal range of motion.     Cervical back: Normal range of motion and neck supple.  Feet:     Right foot:     Toenail Condition: Right toenails are abnormally thick. Fungal disease present.    Left foot:     Toenail Condition: Left toenails are abnormally  thick. Fungal disease present.    Comments: Healthy nails growing out , not all nails affected; no ingrown nails noted Skin:    General: Skin is warm and dry.  Neurological:     General: No focal deficit present.     Mental Status: He is alert and oriented to person, place, and time.  Psychiatric:        Mood and Affect: Mood normal.        Behavior: Behavior normal.        Thought Content: Thought content normal.        Judgment: Judgment normal.     BP 127/90 (BP Location: Left Arm, Patient Position: Sitting, Cuff Size: Normal)   Pulse 66   Temp 98.2 F (36.8 C) (Oral)   Resp 18   Ht 5\' 7"  (1.702 m)   Wt 168 lb (76.2 kg)   SpO2 98%   BMI 26.31 kg/m  Wt Readings from Last 3 Encounters:   11/08/20 168 lb (76.2 kg)  11/01/20 168 lb (76.2 kg)  05/20/19 165 lb (74.8 kg)     Health Maintenance Due  Topic Date Due  . COVID-19 Vaccine (1) Never done  . TETANUS/TDAP  Never done  . COLONOSCOPY (Pts 45-36yrs Insurance coverage will need to be confirmed)  Never done    There are no preventive care reminders to display for this patient.  No results found for: TSH Lab Results  Component Value Date   WBC 7.3 11/08/2020   HGB 15.3 11/08/2020   HCT 46.9 11/08/2020   MCV 89 11/08/2020   PLT 378 11/08/2020   Lab Results  Component Value Date   NA 139 11/08/2020   K 4.4 11/08/2020   CO2 24 05/20/2019   GLUCOSE 90 11/08/2020   BUN 17 11/08/2020   CREATININE 0.68 (L) 11/08/2020   BILITOT 0.3 11/08/2020   ALKPHOS 87 11/08/2020   AST 38 11/08/2020   PROT 7.2 11/08/2020   ALBUMIN 4.5 11/08/2020   CALCIUM 9.2 11/08/2020   ANIONGAP 8 05/20/2019   Lab Results  Component Value Date   CHOL 233 (H) 11/08/2020   Lab Results  Component Value Date   HDL 51 11/08/2020   Lab Results  Component Value Date   LDLCALC 166 (H) 11/08/2020   Lab Results  Component Value Date   TRIG 93 11/08/2020   Lab Results  Component Value Date   CHOLHDL 4.6 11/08/2020   No results found for: HGBA1C    Assessment & Plan:   Problem List Items Addressed This Visit      Musculoskeletal and Integument   Onychomycosis    Other Visit Diagnoses    Screening for metabolic disorder    -  Primary   Relevant Orders   CBC with Differential/Platelet (Completed)   Comp. Metabolic Panel (12) (Completed)   Lipid panel (Completed)   Screening for HIV without presence of risk factors       Relevant Orders   HIV antibody (with reflex) (Completed)   Encounter for HCV screening test for low risk patient       Relevant Orders   HCV Ab w/Rflx to Verification (Completed)    1. Screening for metabolic disorder Fasting labs completed today, patient given appointment to establish care at Primary  Care at [facility] on December 19, 2020. - CBC with Differential/Platelet - Comp. Metabolic Panel (12) - Lipid panel  2. Screening for HIV without presence of risk factors  - HIV antibody (with reflex)  3. Encounter  for HCV screening test for low risk patient  - HCV Ab w/Rflx to Verification  4.  Onychomycosis Patient education given, trial over-the-counter nail lacquer, keep nails  Trimmed   I have reviewed the patient's medical history (PMH, PSH, Social History, Family History, Medications, and allergies) , and have been updated if relevant. I spent 20 minutes reviewing chart and  face to face time with patient.      No orders of the defined types were placed in this encounter.   Follow-up: Return in 6 weeks (on 12/19/2020), or if symptoms worsen or fail to improve, for with Ricky Stabs, NP at Primary Care at [facility].    Kasandra Knudsen Mayers, PA-C

## 2020-11-08 NOTE — Progress Notes (Signed)
Patient presents for blood draw and tolerated it well with one stick. Patient reports noticing a toe nail discoloration on the left great toe and now discoloration is present on the right toe. Patient denies pain or itching in the site. Patient states toe nails are thick.

## 2020-11-09 LAB — CBC WITH DIFFERENTIAL/PLATELET
Basophils Absolute: 0 10*3/uL (ref 0.0–0.2)
Basos: 1 %
EOS (ABSOLUTE): 0.1 10*3/uL (ref 0.0–0.4)
Eos: 2 %
Hematocrit: 46.9 % (ref 37.5–51.0)
Hemoglobin: 15.3 g/dL (ref 13.0–17.7)
Immature Grans (Abs): 0 10*3/uL (ref 0.0–0.1)
Immature Granulocytes: 0 %
Lymphocytes Absolute: 2.3 10*3/uL (ref 0.7–3.1)
Lymphs: 32 %
MCH: 29.1 pg (ref 26.6–33.0)
MCHC: 32.6 g/dL (ref 31.5–35.7)
MCV: 89 fL (ref 79–97)
Monocytes Absolute: 0.6 10*3/uL (ref 0.1–0.9)
Monocytes: 9 %
Neutrophils Absolute: 4.2 10*3/uL (ref 1.4–7.0)
Neutrophils: 56 %
Platelets: 378 10*3/uL (ref 150–450)
RBC: 5.25 x10E6/uL (ref 4.14–5.80)
RDW: 12.6 % (ref 11.6–15.4)
WBC: 7.3 10*3/uL (ref 3.4–10.8)

## 2020-11-09 LAB — HCV INTERPRETATION

## 2020-11-09 LAB — LIPID PANEL
Chol/HDL Ratio: 4.6 ratio (ref 0.0–5.0)
Cholesterol, Total: 233 mg/dL — ABNORMAL HIGH (ref 100–199)
HDL: 51 mg/dL (ref >39)
LDL Chol Calc (NIH): 166 mg/dL — ABNORMAL HIGH (ref 0–99)
Triglycerides: 93 mg/dL (ref 0–149)
VLDL Cholesterol Cal: 16 mg/dL (ref 5–40)

## 2020-11-09 LAB — COMP. METABOLIC PANEL (12)
AST: 38 IU/L (ref 0–40)
Albumin/Globulin Ratio: 1.7 (ref 1.2–2.2)
Albumin: 4.5 g/dL (ref 4.0–5.0)
Alkaline Phosphatase: 87 IU/L (ref 44–121)
BUN/Creatinine Ratio: 25 — ABNORMAL HIGH (ref 9–20)
BUN: 17 mg/dL (ref 6–24)
Bilirubin Total: 0.3 mg/dL (ref 0.0–1.2)
Calcium: 9.2 mg/dL (ref 8.7–10.2)
Chloride: 103 mmol/L (ref 96–106)
Creatinine, Ser: 0.68 mg/dL — ABNORMAL LOW (ref 0.76–1.27)
Globulin, Total: 2.7 g/dL (ref 1.5–4.5)
Glucose: 90 mg/dL (ref 65–99)
Potassium: 4.4 mmol/L (ref 3.5–5.2)
Sodium: 139 mmol/L (ref 134–144)
Total Protein: 7.2 g/dL (ref 6.0–8.5)
eGFR: 115 mL/min/{1.73_m2} (ref >59)

## 2020-11-09 LAB — HCV AB W/RFLX TO VERIFICATION: HCV Ab: <0.1 s/co ratio (ref 0.0–0.9)

## 2020-11-09 LAB — HIV ANTIBODY (ROUTINE TESTING W REFLEX): HIV Screen 4th Generation wRfx: NONREACTIVE

## 2020-11-11 DIAGNOSIS — B351 Tinea unguium: Secondary | ICD-10-CM | POA: Insufficient documentation

## 2020-11-13 ENCOUNTER — Telehealth: Payer: Self-pay | Admitting: *Deleted

## 2020-11-13 NOTE — Telephone Encounter (Signed)
Medical Assistant used [employer] to contact patient.  Interpreter Name:Carmen Interpreter #: [phone] Voicemail states to give a call back to [place] with [facility] at [phone].

## 2020-11-13 NOTE — Telephone Encounter (Signed)
-----   Message from Cari S Mayers, PA-C sent at 11/13/2020 10:25 AM EDT ----- Please call patient and let him know that his screening for hepatitis C and HIV were negative, he does not show any signs of anemia.  His kidney function and liver function are within normal limits.  His cholesterol is elevated, his risk of a cardiovascular event in the next 10 years is 3%, he does not need to start medication at this time, however I do recommend that he work on a low-cholesterol diet and take a supplement of fish oil over-the-counter.  He should have his cholesterol rechecked in 6 months to 1 year.   The 10-year ASCVD risk score (Goff DC Jr., et al., 2013) is: 3.1%   Values used to calculate the score:     Age: 49 years     Sex: Male     Is Non-Hispanic African American: No     Diabetic: No     Tobacco smoker: No     Systolic Blood Pressure: 127 mmHg     Is BP treated: No     HDL Cholesterol: 51 mg/dL     Total Cholesterol: 233 mg/dL  

## 2020-11-14 ENCOUNTER — Telehealth: Payer: Self-pay | Admitting: *Deleted

## 2020-11-14 NOTE — Telephone Encounter (Signed)
-----   Message from Roney Jaffe, [place] sent at 11/13/2020 10:25 AM EDT ----- Please call patient and let him know that his screening for hepatitis C and HIV were negative, he does not show any signs of anemia.  His kidney function and liver function are within normal limits.  His cholesterol is elevated, his risk of a cardiovascular event in the next 10 years is 3%, he does not need to start medication at this time, however I do recommend that he work on a low-cholesterol diet and take a supplement of fish oil over-the-counter.  He should have his cholesterol rechecked in 6 months to 1 year.   The 10-year ASCVD risk score Denman George DC Montez Hageman., et al., 2013) is: 3.1%   Values used to calculate the score:     Age: 50 years     Sex: Male     Is Non-Hispanic African American: No     Diabetic: No     Tobacco smoker: No     Systolic Blood Pressure: 127 mmHg     Is BP treated: No     HDL Cholesterol: 51 mg/dL     Total Cholesterol: 233 mg/dL

## 2020-11-14 NOTE — Telephone Encounter (Signed)
Patient verified DOB Patient is aware of labs results being normal and needing to adhere to low cholesterol diet to limit his risk factor for cardiovascular event. Patient asked for a copy to be printed and was provided the location of [facility] for pick up.

## 2020-11-14 NOTE — Telephone Encounter (Signed)
Patient verified DOB Patient is aware of labs being normal and needing to adhere to a low cholesterol diet to avoid his risk factor increasing.
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
## 2020-12-19 ENCOUNTER — Encounter: Payer: Self-pay | Admitting: Family

## 2020-12-19 ENCOUNTER — Ambulatory Visit (INDEPENDENT_AMBULATORY_CARE_PROVIDER_SITE_OTHER): Payer: Self-pay | Admitting: Family

## 2020-12-19 ENCOUNTER — Other Ambulatory Visit: Payer: Self-pay

## 2020-12-19 VITALS — BP 131/88 | HR 68 | Ht 67.01 in | Wt 177.8 lb

## 2020-12-19 DIAGNOSIS — Z7689 Persons encountering health services in other specified circumstances: Secondary | ICD-10-CM

## 2020-12-19 DIAGNOSIS — Z599 Problem related to housing and economic circumstances, unspecified: Secondary | ICD-10-CM

## 2020-12-19 DIAGNOSIS — M5441 Lumbago with sciatica, right side: Secondary | ICD-10-CM

## 2020-12-19 DIAGNOSIS — G8929 Other chronic pain: Secondary | ICD-10-CM

## 2020-12-19 DIAGNOSIS — Z789 Other specified health status: Secondary | ICD-10-CM

## 2020-12-19 DIAGNOSIS — M5442 Lumbago with sciatica, left side: Secondary | ICD-10-CM

## 2020-12-19 DIAGNOSIS — Z23 Encounter for immunization: Secondary | ICD-10-CM

## 2020-12-19 MED ORDER — IBUPROFEN 600 MG PO TABS: 600.0000 mg | ORAL_TABLET | Freq: Three times a day (TID) | ORAL | 0 refills | Status: DC | PRN

## 2020-12-19 MED ORDER — GABAPENTIN 300 MG PO CAPS: 300.0000 mg | ORAL_CAPSULE | Freq: Every day | ORAL | 0 refills | Status: DC

## 2020-12-19 NOTE — Progress Notes (Signed)
Establish care Experiencing low back pain using Advil for pain relief  Tetanus administered in left deltoid

## 2020-12-19 NOTE — Patient Instructions (Signed)
- Regresar para examen fsico anual, laboratorios y mantenimiento de [address] Llegue en ayunas, lo que significa no tener por lo menos 8 horas antes de la cita. Puede tomar solo agua o caf negro. Tome los medicamentos programados con normalidad. Lonnie Holt elegir Primary Care at [facility] su hogar mdico!  Lonnie Holt fue visto hoy por Lonnie Fendt, NP.  El proveedor de atencin primaria de [place] es Lonnie Streater Jodi Geralds, NP. Para obtener la mejor atencin posible, debe tratar de ver a Lonnie Stabs, NP cada vez que vengas a la clnica.  Esperamos verte de nuevo pronto!  Si tiene Lonnie Holt su visita de hoy, por favor llmenos al [phone]  O no dude en comunicarse con su proveedor a [employer].  Plan de alimentacin DASH DASH Eating Plan DASH es la sigla en ingls de "Enfoques Alimentarios para Detener la Hipertensin". El plan de alimentacin DASH ha demostrado:  Bajar la presin arterial elevada (hipertensin).  Reducir el riesgo de diabetes tipo 2, enfermedad cardaca y accidente cerebrovascular.  Ayudar a perder peso. Consejos para seguir Consulting civil engineer las etiquetas de los alimentos  Verifique la cantidad de sal (sodio) por porcin en las etiquetas de los alimentos. Elija alimentos con menos del 5 por ciento del valor diario de sodio. Generalmente, los alimentos con menos de 300 miligramos (mg) de sodio por porcin se encuadran dentro de este plan alimentario.  Para encontrar cereales integrales, busque la palabra "integral" como primera palabra en la lista de ingredientes. Al ir de compras  Compre productos en los que en su etiqueta diga: "bajo contenido de sodio" o "sin agregado de sal".  Compre alimentos frescos. Evite los alimentos enlatados y comidas precocidas o congeladas. Al cocinar  Evite agregar sal cuando cocine. Use hierbas o aderezos sin sal, en lugar de sal de mesa o sal marina. Consulte al mdico o farmacutico  antes de usar sustitutos de la sal.  No fra los alimentos. A la hora de cocinar los alimentos opte por hornearlos, hervirlos, grillarlos, asarlos al horno y asarlos a Patent attorney.  Cocine con aceites cardiosaludables, como oliva, canola, aguacate, soja o girasol. Planificacin de las comidas  Consuma una dieta equilibrada, que incluya lo siguiente: ? 4o ms porciones de frutas y 4 o ms porciones de Warehouse manager. Trate de que medio plato de cada comida sea de frutas y verduras. ? De 6 a 8porciones de cereales [employer]. ? Menos de 6 onzas (170g) de carne, aves o pescado Copy. Una porcin de 3 onzas (85g) de carne tiene casi el mismo tamao que un mazo de cartas. Un huevo equivale a 1 onza (28g). ? De 2 a 3 porciones de productos lcteos descremados por da. Una porcin es 1taza ( ). ? 1 porcin de frutos secos, semillas o frijoles 5 veces por semana. ? De 2 a 3 porciones de grasas cardiosaludables. Las grasas saludables llamadas cidos grasos omega-3 se encuentran en alimentos como las nueces, las semillas de [place], las leches fortificadas y [place]. Estas grasas tambin se encuentran en los pescados de agua fra, como la sardina, el salmn y la caballa.  Limite la cantidad que consume de: ? Alimentos enlatados o envasados. ? Alimentos con alto contenido de grasa trans, como algunos alimentos fritos. ? Alimentos con alto contenido de grasa saturada, como carne con grasa. ? Postres y otros dulces, bebidas azucaradas y otros alimentos con azcar agregada. ? Productos lcteos enteros.  No le  agregue sal a los alimentos antes de probarlos.  No coma ms de 4 yemas de huevo por semana.  Trate de comer al menos 2 comidas vegetarianas por semana.  Consuma ms comida casera y menos de restaurante, de bares y comida rpida.   Estilo de vida  Cuando coma en un restaurante, pida que preparen su comida con menos sal o, en lo posible, sin nada de  sal.  Si bebe alcohol: ? Limite la cantidad que bebe:  De 0 a 1 medida por da para las mujeres que no estn embarazadas.  De 0 a 2 medidas por da para los hombres. ? Est atento a la cantidad de alcohol que hay en las bebidas que toma. En los [place], una medida equivale a una botella de cerveza de 12oz ( ), un vaso de vino de 5oz ( ) o un vaso de una bebida alcohlica de alta graduacin de 1oz (13ml). Informacin general  Evite ingerir ms de 2300 mg de sal por da. Si tiene hipertensin, es posible que necesite reducir la ingesta de sodio a 1,500 mg por da.  Trabaje con su mdico para mantener un peso saludable o perder Lonnie [employer]. Pregntele cul es el peso recomendado para usted.  Realice al menos 30 minutos de ejercicio que haga que se acelere su corazn (ejercicio Magazine features editor) la [employer] de la [place]. Estas actividades pueden incluir caminar, nadar o andar en bicicleta.  Trabaje con su mdico o nutricionista para ajustar su plan alimentario a sus necesidades calricas personales. Qu alimentos debo comer? Frutas Todas las frutas frescas, congeladas o disecadas. Frutas enlatadas en jugo natural (sin agregado de azcar). Verduras Verduras frescas o congeladas (crudas, al vapor, asadas o grilladas). Jugos de tomate y verduras con bajo contenido de sodio o reducidos en sodio. Salsa y pasta de tomate con bajo contenido de sodio o reducidas en sodio. Verduras enlatadas con bajo contenido de sodio o reducidas en sodio. Granos Pan de salvado o integral. Pasta de salvado o integral. Arroz integral. Avena. Quinua. Trigo burgol. Cereales integrales y con bajo contenido de [place]. Pan pita. Galletitas de [place] con bajo contenido de [place] y [place]. Tortillas de [place] integral. Carnes y otras protenas Pollo o pavo sin piel. Carne de pollo o de [place]. Cerdo desgrasado. Pescado y [employer]. Claras de huevo. Porotos, guisantes o lentejas secos. Frutos secos,  mantequilla de frutos secos y semillas sin sal. Frijoles enlatados sin sal. Cortes de carne vacuna magra, desgrasada. Carne precocida o curada magra y baja en sodio, como embutidos o panes de carne. Lcteos Leche descremada (1%) o descremada. Quesos reducidos en grasa, con bajo contenido de grasa o descremados. Queso blanco o ricota sin grasa, con bajo contenido de [place]. Yogur semidescremado o descremado. Queso con bajo contenido de [place] y [address]. Grasas y [employer] untables que no contengan grasas trans. Aceite vegetal. Jerolyn Shin y aderezos para ensaladas livianos, reducidos en grasa o con bajo contenido de grasas (reducidos en sodio). Aceite de canola, crtamo, oliva, aguacate, soja y [place]. [place]. Alios y condimentos Hierbas. Especias. Mezclas de condimentos sin sal. Otros alimentos Palomitas de maz y pretzels sin sal. Dulces con bajo contenido de grasas. Es posible que los productos que se enumeran ms [place] no constituyan una lista completa de los alimentos y las bebidas que puede tomar. Consulte a un nutricionista para obtener ms informacin. Qu alimentos debo evitar? Nils Pyle Fruta enlatada en almbar liviano o espeso. Frutas cocidas en aceite. Frutas con salsa de crema o mantequilla. Donette Larry  con crema o fritas. Verduras en salsa de [place]. Verduras enlatadas regulares (que no sean con bajo contenido de sodio o reducidas en sodio). Pasta y salsa de tomates enlatadas regulares (que no sean con bajo contenido de sodio o reducidas en sodio). Jugos de tomate y verduras regulares (que no sean con bajo contenido de sodio o reducidos en sodio). Pepinillos. Aceitunas. Granos Productos de panificacin hechos con grasa, como medialunas, magdalenas y algunos panes. Comidas con arroz o pasta seca listas para usar. Carnes y [address] de carne con alto contenido de Holiday representative. Costillas. Carne frita. [place]. [place], salame y otras carnes precocidas o curadas, como  embutidos o panes de carne. Grasa de la espalda del cerdo (panceta). Salchicha de cerdo. Frutos secos y semillas con sal. Frijoles enlatados con agregado de sal. Pescado enlatado o ahumado. Huevos enteros o yemas. Pollo o pavo con piel. Lcteos Leche entera o al 2%, crema y [address] y mitad crema. Queso crema entero o con toda su grasa. Yogur entero o endulzado. Quesos con toda su grasa. Sustitutos de cremas no lcteas. Coberturas batidas. Quesos para untar y quesos procesados. Grasas y [employer]. Margarina en barra. Manteca de cerdo. Lardo. Mantequilla clarificada. Grasa de panceta. Aceites tropicales como aceite de coco, palmiste o palma. Alios y condimentos Sal de cebolla, sal de ajo, sal condimentada, sal de mesa y sal marina. Salsa [place]shire. Salsa trtara. Salsa barbacoa. Salsa teriyaki. Salsa de soja, incluso la que tiene contenido reducido de [place]. Salsa de carne. Salsas en lata y envasadas. Salsa de pescado. Salsa de [place]. Salsa rosada. Rbanos picantes comprados en tiendas. [place]. [place]. Saborizantes y tiernizantes para carne. Caldo en cubitos. Salsas picantes. Adobos preelaborados o envasados. Aderezos para tacos preelaborados o envasados. Salsas de pepinillos. Aderezos comunes para ensalada. Otros alimentos Palomitas de maz y pretzels con sal. Es posible que los productos que se enumeran ms arriba no constituyan una lista completa de los alimentos y las bebidas que Personnel officer. Consulte a un nutricionista para obtener ms informacin. Dnde buscar ms informacin  National Heart, Lung, and Blood Institute (Instituto Nacional del [place], los Pulmones y Risk manager): PopSteam.is  American Heart Association (Asociacin Estadounidense del [place]n): www.heart.org  Academy of Nutrition and Dietetics (Academia de Nutricin y Pension scheme manager): www.eatright.org  National Kidney Foundation (Fundacin Nacional del Rin): www.kidney.org Resumen  El plan de  alimentacin DASH ha demostrado bajar la presin arterial elevada (hipertensin). Tambin puede reducir [employer] de diabetes tipo 2, enfermedad cardaca y accidente cerebrovascular.  Cuando siga el plan de alimentacin DASH, trate de comer ms frutas frescas y verduras, cereales integrales, carnes magras, lcteos descremados y grasas cardiosaludables.  Con el plan de alimentacin DASH, deber limitar el consumo de sal (sodio) a 2,300 mg por da. Si tiene hipertensin, es posible que necesite reducir la ingesta de sodio a 1,500 mg por da.  Trabaje con su mdico o nutricionista para ajustar su plan alimentario a sus necesidades calricas personales. Esta informacin no tiene Theme park manager el consejo del mdico. Asegrese de hacerle al mdico cualquier pregunta que tenga. Document Revised: 09/23/2019 Document Reviewed: 09/23/2019 Elsevier Patient Education  2021 [employer].

## 2020-12-27 ENCOUNTER — Ambulatory Visit: Payer: Self-pay

## 2021-01-15 NOTE — Progress Notes (Deleted)
Patient ID: Lonnie Holt, male    DOB: [birth year]  MRN: [id number]  CC: Back Pain Follow-Up  Subjective: Lonnie Holt is a 50 y.o. male who presents for back pain follow-up.  His concerns today include:  1. BACK PAIN FOLLOW-UP: 12/19/2020: - Begin Ibuprofen as prescribed.  - Begin Gabapentin as prescribed.   01/16/2021:  Location:  Onset:  Description:  Radiation: Modifying factors:   Symptoms Worse with:  Better with:  Trauma:  Popping/clicking sounds with movement/bending:  Muscle spasms:  Red Flags Fecal/urinary incontinence:  Weakness:  Fever/chills:  Night pain:  Unexplained weight loss:  No relief with bedrest:  Cancer/immunosuppression:  IV drug use:  PMH of osteoporosis or chronic steroid use:   Comments:   Patient Active Problem List   Diagnosis Date Noted  . Onychomycosis 11/11/2020  . Acute right-sided low back pain with right-sided sciatica 11/01/2020  . Redness of eye, left 05/16/2015  . Acute pharyngitis 05/23/2011  . Shoulder pain, right 01/08/2011  . GASTROESOPHAGEAL REFLUX, NO ESOPHAGITIS 10/30/2006  . BACK PAIN W/RADIATION, UNSPECIFIED 10/30/2006     Current Outpatient Medications on File Prior to Visit  Medication Sig Dispense Refill  . gabapentin (NEURONTIN) 300 MG capsule Take 1 capsule (300 mg total) by mouth at bedtime. 30 capsule 0  . ibuprofen (ADVIL) 600 MG tablet Take 1 tablet (600 mg total) by mouth every 8 (eight) hours as needed. 30 tablet 0   No current facility-administered medications on file prior to visit.    No Known Allergies  Social History   Socioeconomic History  . Marital status: Married    Spouse name: Not on file  . Number of children: Not on file  . Years of education: Not on file  . Highest education level: Not on file  Occupational History  . Not on file  Tobacco Use  . Smoking status: Never Smoker  . Smokeless tobacco: Never Used  Vaping Use  . Vaping Use: Never used  Substance  and Sexual Activity  . Alcohol use: No  . Drug use: No  . Sexual activity: Not on file  Other Topics Concern  . Not on file  Social History Narrative  . Not on file   Social Determinants of Health   Financial Resource Strain: Not on file  Food Insecurity: Not on file  Transportation Needs: Not on file  Physical Activity: Not on file  Stress: Not on file  Social Connections: Not on file  Intimate Partner Violence: Not on file    No family history on file.  No past surgical history on file.  ROS: Review of Systems Negative except as stated above  PHYSICAL EXAM: There were no vitals taken for this visit.  Physical Exam  {male adult master:310786} {male adult master:310785}  CMP Latest Ref Rng & Units 11/08/2020 05/20/2019  Glucose 65 - 99 mg/dL 90 96  BUN 6 - 24 mg/dL 17 16  Creatinine 8.09 - 1.27 mg/dL 9.83(J) 8.25  Sodium 053 - 144 mmol/L 139 139  Potassium 3.5 - 5.2 mmol/L 4.4 3.8  Chloride 96 - 106 mmol/L 103 107  CO2 22 - 32 mmol/L - 24  Calcium 8.7 - 10.2 mg/dL 9.2 9.7(Q)  Total Protein 6.0 - 8.5 g/dL 7.2 -  Total Bilirubin 0.0 - 1.2 mg/dL 0.3 -  Alkaline Phos 44 - 121 IU/L 87 -  AST 0 - 40 IU/L 38 -   Lipid Panel     Component Value Date/Time   CHOL 233 (H) 11/08/2020  1033   TRIG 93 11/08/2020 1033   HDL 51 11/08/2020 1033   CHOLHDL 4.6 11/08/2020 1033   CHOLHDL 5.0 Ratio 11/24/2006 2030   VLDL 14 11/24/2006 2030   LDLCALC 166 (H) 11/08/2020 1033    CBC    Component Value Date/Time   WBC 7.3 11/08/2020 1033   WBC 7.6 05/20/2019 1928   RBC 5.25 11/08/2020 1033   RBC 4.65 05/20/2019 1928   HGB 15.3 11/08/2020 1033   HCT 46.9 11/08/2020 1033   PLT 378 11/08/2020 1033   MCV 89 11/08/2020 1033   MCH 29.1 11/08/2020 1033   MCH 30.3 05/20/2019 1928   MCHC 32.6 11/08/2020 1033   MCHC 33.5 05/20/2019 1928   RDW 12.6 11/08/2020 1033   LYMPHSABS 2.3 11/08/2020 1033   EOSABS 0.1 11/08/2020 1033   BASOSABS 0.0 11/08/2020 1033    ASSESSMENT AND  PLAN:  There are no diagnoses linked to this encounter.   Patient was given the opportunity to ask questions.  Patient verbalized understanding of the plan and was able to repeat key elements of the plan. Patient was given clear instructions to go to Emergency Department or return to medical center if symptoms don't improve, worsen, or new problems develop.The patient verbalized understanding.   No orders of the defined types were placed in this encounter.    Requested Prescriptions    No prescriptions requested or ordered in this encounter    No follow-ups on file.  Rema Fendt, NP

## 2021-01-16 ENCOUNTER — Ambulatory Visit: Payer: Self-pay | Admitting: Family

## 2021-01-17 NOTE — Progress Notes (Signed)
Patient ID: Lonnie Holt, male    DOB: 08-09-1971  MRN: [id number]  CC: Back Pain Follow-Up  Subjective: Lonnie Holt is a 50 y.o. male who presents for back pain follow-up. His concerns today include:   1. BACK PAIN FOLLOW-UP: 12/19/2020: - Begin Ibuprofen as prescribed. Take with food and plenty of water. Do not take any other products containing Ibuprofen, Naproxen, Aleve, Motrin, and Aspirin. - Begin Gabapentin as prescribed. May cause drowsiness. Counseled patient to not consume if operating heavy machinery or driving. Counseled patient to not consume with alcohol. Patient verbalized understanding.  - Follow-up with primary provider as scheduled.   01/19/2021: Doing well on current regimen. Notices improvement with pain symptoms. Able to go to work without pain. Requesting refills.   Patient Active Problem List   Diagnosis Date Noted  . Onychomycosis 11/11/2020  . Acute right-sided low back pain with right-sided sciatica 11/01/2020  . Redness of eye, left 05/16/2015  . Acute pharyngitis 05/23/2011  . Shoulder pain, right 01/08/2011  . GASTROESOPHAGEAL REFLUX, NO ESOPHAGITIS 10/30/2006  . BACK PAIN W/RADIATION, UNSPECIFIED 10/30/2006     No current outpatient medications on file prior to visit.   No current facility-administered medications on file prior to visit.    No Known Allergies  Social History   Socioeconomic History  . Marital status: Married    Spouse name: Not on file  . Number of children: Not on file  . Years of education: Not on file  . Highest education level: Not on file  Occupational History  . Not on file  Tobacco Use  . Smoking status: Never Smoker  . Smokeless tobacco: Never Used  Vaping Use  . Vaping Use: Never used  Substance and Sexual Activity  . Alcohol use: No  . Drug use: No  . Sexual activity: Not on file  Other Topics Concern  . Not on file  Social History Narrative  . Not on file   Social Determinants of  Health   Financial Resource Strain: Not on file  Food Insecurity: Not on file  Transportation Needs: Not on file  Physical Activity: Not on file  Stress: Not on file  Social Connections: Not on file  Intimate Partner Violence: Not on file    No family history on file.  No past surgical history on file.  ROS: Review of Systems Negative except as stated above  PHYSICAL EXAM: BP 127/87 (BP Location: Left Arm, Patient Position: Sitting, Cuff Size: Normal)   Pulse 75   Temp 98.4 F (36.9 C)   Resp 15   Ht 5' 7.01" (1.702 m)   Wt 176 lb 6.4 oz (80 kg)   SpO2 96%   BMI 27.62 kg/m   Physical Exam HENT:     Head: Normocephalic and atraumatic.  Eyes:     Extraocular Movements: Extraocular movements intact.     Conjunctiva/sclera: Conjunctivae normal.     Pupils: Pupils are equal, round, and reactive to light.  Cardiovascular:     Rate and Rhythm: Normal rate and regular rhythm.     Pulses: Normal pulses.     Heart sounds: Normal heart sounds.  Pulmonary:     Effort: Pulmonary effort is normal.     Breath sounds: Normal breath sounds.  Musculoskeletal:        General: Normal range of motion.     Cervical back: Normal range of motion and neck supple.  Neurological:     General: No focal deficit present.  Mental Status: He is alert and oriented to person, place, and time.  Psychiatric:        Mood and Affect: Mood normal.        Behavior: Behavior normal.     ASSESSMENT AND PLAN: 1. Chronic bilateral low back pain with bilateral sciatica: - Continue Ibuprofen as prescribed.  - Continue Gabapentin as prescribed.  - Follow-up with primary provider as scheduled.  - ibuprofen (ADVIL) 600 MG tablet; Take 1 tablet (600 mg total) by mouth every 8 (eight) hours as needed.  Dispense: 90 tablet; Refill: 1 - gabapentin (NEURONTIN) 300 MG capsule; Take 1 capsule (300 mg total) by mouth at bedtime.  Dispense: 30 capsule; Refill: 2  2. Language barrier: - [place]  Interpreters participated during today's visit. Interpreter Name: Hubert Azure, ID#: [id number].   Patient was given the opportunity to ask questions.  Patient verbalized understanding of the plan and was able to repeat key elements of the plan. Patient was given clear instructions to go to Emergency Department or return to medical center if symptoms don't improve, worsen, or new problems develop.The patient verbalized understanding.   No orders of the defined types were placed in this encounter.    Requested Prescriptions   Signed Prescriptions Disp Refills  . ibuprofen (ADVIL) 600 MG tablet 90 tablet 1    Sig: Take 1 tablet (600 mg total) by mouth every 8 (eight) hours as needed.  . gabapentin (NEURONTIN) 300 MG capsule 30 capsule 2    Sig: Take 1 capsule (300 mg total) by mouth at bedtime.    Follow-up with primary provider as scheduled.   Rema Fendt, NP

## 2021-01-19 ENCOUNTER — Other Ambulatory Visit: Payer: Self-pay

## 2021-01-19 ENCOUNTER — Encounter: Payer: Self-pay | Admitting: Family

## 2021-01-19 ENCOUNTER — Ambulatory Visit (INDEPENDENT_AMBULATORY_CARE_PROVIDER_SITE_OTHER): Payer: Self-pay | Admitting: Family

## 2021-01-19 VITALS — BP 127/87 | HR 75 | Temp 98.4°F | Resp 15 | Ht 67.01 in | Wt 176.4 lb

## 2021-01-19 DIAGNOSIS — G8929 Other chronic pain: Secondary | ICD-10-CM

## 2021-01-19 DIAGNOSIS — Z789 Other specified health status: Secondary | ICD-10-CM

## 2021-01-19 DIAGNOSIS — M5442 Lumbago with sciatica, left side: Secondary | ICD-10-CM

## 2021-01-19 DIAGNOSIS — M5441 Lumbago with sciatica, right side: Secondary | ICD-10-CM

## 2021-01-19 MED ORDER — IBUPROFEN 600 MG PO TABS: 600.0000 mg | ORAL_TABLET | Freq: Three times a day (TID) | ORAL | 1 refills | Status: AC | PRN

## 2021-01-19 MED ORDER — GABAPENTIN 300 MG PO CAPS: 300.0000 mg | ORAL_CAPSULE | Freq: Every day | ORAL | 2 refills | Status: AC

## 2021-01-19 NOTE — Patient Instructions (Signed)
Dolor de espalda crnico Chronic Back Pain Cuando el dolor en la espalda dura ms de 3 meses, se denomina dolor de espalda crnico. Es posible que se desconozca la causa de esta afeccin. Algunas causas frecuentes son las siguientes:  Chiropractor (enfermedad degenerativa) de los huesos, los ligamentos o los discos de la espalda.  Inflamacin y rigidez en la espalda (artritis). Las personas que sufren dolor de espalda crnico generalmente atraviesan determinados perodos en los que este es ms intenso (episodios de exacerbacin del Engineer, mining). Muchas personas pueden aprender a Human resources officer de espalda con el cuidado en Advice worker. Siga estas instrucciones en su casa: Est atento a cualquier cambio en los sntomas. Tome estas medidas para Acupuncturist dolor: Control del dolor y de la rigidez  Si se lo indican, aplique hielo sobre la zona dolorida. El mdico puede recomendarle que se aplique hielo durante las primeras 24a 48horas despus del comienzo de un episodio de exacerbacin del dolor. Para hacer esto: ? Ponga el hielo en una bolsa plstica. ? Coloque una [employer] piel y Copy. ? Coloque el hielo durante , 2a3veces al da.  Si se lo indican, aplique calor en la zona afectada con la frecuencia que le haya indicado el mdico. Use la fuente de calor que el mdico le recomiende, como una compresa de calor hmedo o una almohadilla trmica. ? Coloque una [employer] piel y la fuente de Airline pilot. ? Aplique calor durante 20 a . ? Retire la fuente de calor si la piel se pone de color rojo brillante. Esto es especialmente importante si no puede sentir dolor, calor o fro. Puede correr un riesgo mayor de sufrir quemaduras.  Intente tomar un bao de inmersin con agua caliente.      Actividad  Evite agacharse y Education officer, environmental otras actividades que agraven el problema.  Mantenga una postura correcta mientras est de pie o sentado: ? Cuando est de pie, mantenga la parte  alta de la espalda y el cuello rectos, con los hombros [place]. Evite encorvarse. ? Cuando est sentado, mantenga la espalda recta y relaje los hombros. No curve los hombros ni los eche [place].  No permanezca sentado o de pie en el mismo lugar durante mucho tiempo.  Durante el da, descanse durante lapsos breves. Esto le Engineer, materials. Descansar recostado o de pie suele ser mejor que hacerlo sentado.  Cuando descanse durante perodos ms largos, incorpore alguna [place] o ejercicios de elongacin entre uno y [place]. Esto ayudar a Transport planner rigidez y Chief Technology Officer.  Haga ejercicio con regularidad. Pregntele al mdico qu actividades son seguras para usted.  No levante ningn objeto que pese ms de 10libras (4.5kg) o que supere el lmite de peso que le hayan indicado, hasta que el mdico le diga que puede [place]. Siempre use las tcnicas correctas para levantar objetos, entre ellas: ? Flexionar las rodillas. ? Mantener la carga cerca del cuerpo. ? No torcerse.  Duerma sobre un colchn firme en una posicin cmoda. Intente acostarse de costado, con las rodillas ligeramente flexionadas. Si se recuesta [employer], coloque una almohada debajo de las rodillas.   Medicamentos  El tratamiento puede incluir medicamentos para Chief Technology Officer y la inflamacin que se toman por boca o que se aplican sobre la piel, analgsicos recetados o relajantes musculares. Use los medicamentos de venta libre y los recetados solamente como se lo haya indicado el mdico.  Pregntele al mdico si el medicamento recetado: ? Hace necesario que  evite conducir o Child psychotherapist. ? Puede causarle estreimiento. Es posible que tenga que tomar estas medidas para prevenir o tratar el estreimiento:  Product manager suficiente lquido como para Pharmacologist la orina de color amarillo plido.  Usar medicamentos recetados o de Sales promotion account executive.  Consumir alimentos ricos en fibra, como frijoles, cereales integrales, y frutas y  verduras frescas.  Limitar el consumo de alimentos ricos en grasa y azcares procesados, como los alimentos fritos o dulces. Instrucciones generales  No consuma ningn producto que contenga nicotina o tabaco, como cigarrillos, cigarrillos electrnicos y tabaco de Theatre manager. Si necesita ayuda para dejar de consumir estos productos, consulte al mdico.  Concurra a todas las visitas de seguimiento como se lo haya indicado el mdico. Esto es importante. Comunquese con un mdico si:  Siente un dolor que no se alivia con reposo o medicamentos.  Su dolor empeora o tiene un dolor nuevo.  Tiene fiebre alta.  Pierde de peso con rapidez.  Tiene dificultad para [employer] cotidianas. Solicite ayuda de inmediato si:  Siente debilidad o adormecimiento en una o ambas piernas, o en uno o ambos pies.  Tiene dificultad para controlar la miccin o la defecacin.  Siente un dolor intenso en la espalda y tiene alguno de los siguientes sntomas: ? Nuseas o vmitos. ? Dolor en el abdomen. ? Le falta el aire o se desmaya. Resumen  El dolor de espalda crnico es aquel que dura ms de 3 meses.  Cuando comienza un episodio de exacerbacin del dolor, aplique hielo en la zona dolorida durante las primeras 24 a 48 horas.  Aplquese una compresa de calor hmedo o una almohadilla trmica como se lo haya indicado el mdico.  Cuando descanse durante perodos ms largos, incorpore alguna [place] o ejercicios de elongacin entre uno y [place]. Esto ayudar a Transport planner rigidez y Chief Technology Officer. Esta informacin no tiene Theme park manager el consejo del mdico. Asegrese de hacerle al mdico cualquier pregunta que tenga. Document Revised: 12/14/2019 Document Reviewed: 12/14/2019 Elsevier Patient Education  2021 [employer].

## 2021-01-19 NOTE — Progress Notes (Signed)
Pain f/u Desires refill on Gabapentin
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
## 2021-02-20 ENCOUNTER — Ambulatory Visit: Payer: Self-pay

## 2021-02-27 ENCOUNTER — Other Ambulatory Visit: Payer: Self-pay

## 2021-02-27 ENCOUNTER — Ambulatory Visit: Payer: Self-pay | Attending: Family

## 2021-12-31 ENCOUNTER — Encounter: Payer: Self-pay | Admitting: Physician Assistant

## 2021-12-31 ENCOUNTER — Ambulatory Visit (INDEPENDENT_AMBULATORY_CARE_PROVIDER_SITE_OTHER): Payer: Self-pay | Admitting: Physician Assistant

## 2021-12-31 VITALS — BP 123/81 | HR 75 | Temp 97.9°F | Resp 18 | Ht 67.0 in | Wt 180.0 lb

## 2021-12-31 DIAGNOSIS — A048 Other specified bacterial intestinal infections: Secondary | ICD-10-CM

## 2021-12-31 DIAGNOSIS — R1032 Left lower quadrant pain: Secondary | ICD-10-CM

## 2021-12-31 DIAGNOSIS — K219 Gastro-esophageal reflux disease without esophagitis: Secondary | ICD-10-CM

## 2021-12-31 MED ORDER — OMEPRAZOLE 40 MG PO CPDR: 40.0000 mg | DELAYED_RELEASE_CAPSULE | Freq: Every day | ORAL | 1 refills | Status: DC

## 2021-12-31 NOTE — Patient Instructions (Signed)
You are going to start taking omeprazole once daily for the next 4 weeks.  I encourage you to eat a gentle diet.  Make sure that you are staying well-hydrated and get plenty of rest.  We will call you with today's lab results. ? ?Roney Jaffe, PA-C ?Physician Assistant ?[facility] ?[email or web address] ? ? ?Gastritis en adultos ?Gastritis, Adult ?La gastritis es la inflamaci?n del est?mago. Hay dos tipos de gastritis: ?Gastritis aguda. Este tipo aparece de manera repentina. ?Gastritis cr?nica. Este tipo es mucho m?s frecuente. Se desarrolla lentamente y dura un largo tiempo. ?La gastritis se manifiesta cuando el revestimiento del est?mago se debilita o se lesiona. Sin tratamiento, la gastritis puede causar sangrado y ?lceras estomacales. ??Cu?les son las causas? ?Esta afecci?n puede ser causada por lo siguiente: ?Una infecci?n. ?Beber alcohol en exceso. ?Ciertos medicamentos. Estos incluyen corticoesteroides, antibi?ticos y algunos medicamentos de venta sin receta, como aspirina o ibuprofeno. ?Tener demasiado ?cido en el est?mago. ?Tener una enfermedad del est?mago. ?Algunas otras causas son las siguientes: ?Una reacci?n al?rgica. ?Algunos tratamientos para el c?ncer (radiaci?n). ?Fumar cigarrillos o usar productos que contengan nicotina o tabaco. ?En algunos casos, se desconoce la causa de esta afecci?n. ??Qu? incrementa el riesgo? ?Tener una enfermedad de los intestinos. ?Tener una enfermedad por la cual el propio sistema inmunitario ataca el organismo (enfermedad autoinmunitaria), como la enfermedad de Crohn. ?Usar aspirina o ibuprofeno y otros antiinflamatorios no esteroideos (AINE) para tratar otras afecciones, como enfermedades card?acas o dolor cr?nico. ?Estr?s. ??Cu?les son los signos o s?ntomas? ?Los s?ntomas de esta afecci?n incluyen los siguientes: ?Dolor o sensaci?n de ardor en la parte superior del abdomen. ?N?useas. ?V?mitos. ?Sensaci?n molesta  de saciedad despu?s de comer. ?P?rdida de peso. ?Mal aliento. ?Sangre en el v?mito o en la materia fecal (heces). ?En algunos casos, no hay s?ntomas. ??C?mo se diagnostica? ?Esta afecci?n se puede diagnosticar en funci?n de sus antecedentes m?dicos, un examen f?sico y pruebas. Las pruebas pueden incluir las siguientes: ?Sus antecedentes m?dicos y Neomia Dear descripci?n de sus s?ntomas. ?Un examen f?sico. ?Pruebas. Estas pueden incluir las siguientes: ?Pruebas de [place]. ?Pruebas de heces. ?Una prueba en la que se introduce un instrumento delgado y flexible con Neomia Dear luz y una peque?a c?mara a trav?s del es?fago hasta el est?mago (endoscop?a alta). ?Una prueba en la que se toma [place] de tejido para analizarla con un microscopio (biopsia). ??C?mo se trata? ?Esta afecci?n se puede tratar con medicamentos. Los medicamentos que se utilizan var?an seg?n la causa de la gastritis. ?Si la afecci?n se debe a una infecci?n bacteriana, pueden recetarle medicamentos antibi?ticos. ?Si la afecci?n se debe al exceso de ?cido estomacal, se pueden administrar medicamentos denominados bloqueadores H2, inhibidores de la bomba de protones o anti?cidos. ?El tratamiento tambi?n puede incluir interrumpir el uso de ciertos medicamentos como aspirina o ibuprofeno y otros antiinflamatorios no esteroideos (AINE). ?Siga estas instrucciones en su casa: ?Medicamentos ?Use los medicamentos de venta libre y los recetados solamente como se lo haya indicado el m?dico. ?Si le recetaron un antibi?tico, t?melo como se lo haya indicado el m?dico. No deje de tomar el antibi?tico aunque comience a sentirse mejor. ?Consumo de alcohol ?No beba alcohol si: ?Su m?dico le indica no hacerlo. ?Est? embarazada, puede estar embarazada o est? tratando de quedar embarazada. ?Si bebe alcohol: ?Limite su uso a las siguientes medidas: ?De 0 a 1 medida por d?a para las mujeres. ?De 0 a 2 medidas por d?a para los hombres. ?Sepa cu?nta cantidad de alcohol hay en las bebidas  que  toma. En los [address], una medida equivale a una botella de cerveza de 12 oz (355 ml), un vaso de vino de 5 oz (148 ml) o un vaso de una bebida alcoh?lica de alta graduaci?n de 1? oz (44 ml). ?Instrucciones generales ? ?Haga comidas peque?as y frecuentes durante el d?a en lugar de comidas abundantes. ?Evite los alimentos y las bebidas que intensifican los s?ntomas. ?Hable con el m?dico [employer] formas de controlar el estr?s, Lexicographer ejercicio con regularidad o practicar respiraci?n profunda, meditaci?n o yoga. ?No consuma ning?n producto que contenga nicotina o tabaco. Estos productos incluyen cigarrillos, tabaco para mascar y aparatos de vapeo, como los cigarrillos electr?nicos. Si necesita ayuda para dejar de fumar, consulte al m?dico. ?Beba suficiente l?quido como para mantener la orina de color amarillo p?lido. ?Concurra a todas las visitas de seguimiento. Esto es importante. ?Comun?quese con un m?dico si: ?Sus s?ntomas empeoran. ?El dolor abdominal empeora. ?Los s?ntomas regresan despu?s del tratamiento. ?Tiene fiebre. ?Solicite ayuda de inmediato si: ?Vomita sangre o una sustancia parecida a los posos del caf?Marland Kitchen ?Las heces son negras o de color rojo oscuro. ?No puede retener los l?quidos. ?Estos s?ntomas pueden representar un problema grave que constituye Radio broadcast assistant. No espere a ver si los s?ntomas desaparecen. Solicite atenci?n m?dica de inmediato. Comun?quese con el servicio de emergencias de su localidad (911 en los [place]). No conduzca por sus propios medios [employer]. ?Resumen ?La gastritis es la inflamaci?n del revestimiento del est?mago que puede ocurrir de [address] repentina (aguda) o desarrollarse lentamente con el tiempo (cr?nica). ?Esta afecci?n se diagnostica mediante los antecedentes m?dicos, un examen f?sico o pruebas. ?Esta afecci?n puede tratarse con medicamentos para tratar la infecci?n o medicamentos para reducir la cantidad de ?cido en el est?mago. ?Siga las  instrucciones del m?dico acerca de tomar medicamentos, hacer cambios en la dieta y saber cu?ndo pedir ayuda. ?Esta informaci?n no tiene Theme park manager el consejo del m?dico. Aseg?rese de hacerle al m?dico cualquier pregunta que tenga. ?Document Revised: 01/18/2021 Document Reviewed: 01/18/2021 ?Elsevier Patient Education ? 2023 Elsevier Inc. ? ?

## 2021-12-31 NOTE — Progress Notes (Signed)
Patient has eaten today. ?Patient has not taken medication today. ?Patient reports L side abdominal pain for the past 20 days with 20 minute intervals. Patient reports pain is increased with eating. ?

## 2021-12-31 NOTE — Progress Notes (Signed)
? ?Established Patient Office Visit ? ?Subjective   ?Patient ID: Lonnie Holt, male    DOB: [birth year]  Age: 51 y.o. MRN: [id number] ? ?Chief Complaint  ?Patient presents with  ? Gastroesophageal Reflux  ? ? ?States that he has been having left sided abdominal "burning" for the past month ?States it feels like he "has hot chilis in his stomach" ? ?Sometimes will happen as many as 4 times a day, worse after eating. ? ?States that he has tried tea without relief. ? ?Denies constipation, states that he does regulate his bowels with fiber supplements. ? ?Denies NSAID use.  ? ?Due to language barrier, an interpreter was present during the history-taking and subsequent discussion (and for part of the physical exam) with this patient. ? ? ? ? ?Past Medical History:  ?Diagnosis Date  ? GERD (gastroesophageal reflux disease)   ? ?Social History  ? ?Socioeconomic History  ? Marital status: Married  ?  Spouse name: Not on file  ? Number of children: Not on file  ? Years of education: Not on file  ? Highest education level: Not on file  ?Occupational History  ? Not on file  ?Tobacco Use  ? Smoking status: Never  ? Smokeless tobacco: Never  ?Vaping Use  ? Vaping Use: Never used  ?Substance and Sexual Activity  ? Alcohol use: No  ? Drug use: No  ? Sexual activity: Not on file  ?Other Topics Concern  ? Not on file  ?Social History Narrative  ? Not on file  ? ?Social Determinants of Health  ? ?Financial Resource Strain: Not on file  ?Food Insecurity: Not on file  ?Transportation Needs: Not on file  ?Physical Activity: Not on file  ?Stress: Not on file  ?Social Connections: Not on file  ?Intimate Partner Violence: Not on file  ? ?History reviewed. No pertinent family history. ?No Known Allergies ?  ? ?Review of Systems  ?Constitutional:  Negative for chills and fever.  ?HENT: Negative.    ?Eyes: Negative.   ?Respiratory:  Negative for shortness of breath.   ?Cardiovascular:  Negative for chest pain.  ?Gastrointestinal:   Positive for abdominal pain and heartburn. Negative for constipation, diarrhea, nausea and vomiting.  ?Genitourinary:  Negative for dysuria.  ?Musculoskeletal:  Negative for back pain.  ?Skin: Negative.   ?Neurological: Negative.   ?Endo/Heme/Allergies: Negative.   ?Psychiatric/Behavioral: Negative.    ? ?  ?Objective:  ?  ? ?BP 123/81 (BP Location: Right Arm, Patient Position: Sitting, Cuff Size: Normal)   Pulse 75   Temp 97.9 ?F (36.6 ?C) (Oral)   Resp 18   Ht 5\' 7"  (1.702 m)   Wt 180 lb (81.6 kg)   SpO2 96%   BMI 28.19 kg/m?  ? ? ?Physical Exam ?Vitals and nursing note reviewed.  ?Constitutional:   ?   Appearance: Normal appearance.  ?HENT:  ?   Head: Normocephalic and atraumatic.  ?   Right Ear: External ear normal.  ?   Left Ear: External ear normal.  ?   Nose: Nose normal.  ?   Mouth/Throat:  ?   Mouth: Mucous membranes are moist.  ?   Pharynx: Oropharynx is clear.  ?Eyes:  ?   Extraocular Movements: Extraocular movements intact.  ?   Conjunctiva/sclera: Conjunctivae normal.  ?   Pupils: Pupils are equal, round, and reactive to light.  ?Cardiovascular:  ?   Rate and Rhythm: Normal rate and regular rhythm.  ?   Pulses: Normal pulses.  ?  Heart sounds: Normal heart sounds.  ?Pulmonary:  ?   Effort: Pulmonary effort is normal.  ?   Breath sounds: Normal breath sounds.  ?Abdominal:  ?   General: Abdomen is flat. Bowel sounds are normal.  ?   Palpations: Abdomen is soft.  ?   Tenderness: There is abdominal tenderness in the left upper quadrant. Negative signs include Murphy's sign.  ?   Hernia: No hernia is present.  ?Musculoskeletal:     ?   General: Normal range of motion.  ?   Cervical back: Normal range of motion and neck supple.  ?Skin: ?   General: Skin is warm and dry.  ?Neurological:  ?   General: No focal deficit present.  ?   Mental Status: He is alert and oriented to person, place, and time.  ?Psychiatric:     ?   Mood and Affect: Mood normal.     ?   Behavior: Behavior normal.     ?   Thought  Content: Thought content normal.     ?   Judgment: Judgment normal.  ? ? ? ?  ?Assessment & Plan:  ? ?Problem List Items Addressed This Visit   ? ?  ? Digestive  ? GASTROESOPHAGEAL REFLUX, NO ESOPHAGITIS - Primary  ? Relevant Medications  ? omeprazole (PRILOSEC) 40 MG capsule  ? Other Relevant Orders  ? H Pylori, IGM, IGG, IGA AB  ? Basic metabolic panel  ? ?Other Visit Diagnoses   ? ? Left lower quadrant abdominal pain      ? Relevant Orders  ? H Pylori, IGM, IGG, IGA AB  ? ?  ?1. Gastroesophageal reflux disease without esophagitis ?Trial omeprazole.  Patient education given on supportive care, red flags given for prompt reevaluation. ?- omeprazole (PRILOSEC) 40 MG capsule; Take 1 capsule (40 mg total) by mouth daily.  Dispense: 30 capsule; Refill: 1 ?- H Pylori, IGM, IGG, IGA AB ?- Basic metabolic panel ? ?2. Left lower quadrant abdominal pain ? ?- H Pylori, IGM, IGG, IGA AB ? ? ?I have reviewed the patient's medical history (PMH, PSH, Social History, Family History, Medications, and allergies) , and have been updated if relevant. I spent 30 minutes reviewing chart and  face to face time with patient. ? ? ? ?Return in about 4 weeks (around 01/28/2022) for with Ricky Stabs, NP at [facility].  ? ? ?Mosiah Bastin S Mayers, PA-C ? ?

## 2022-01-03 LAB — BASIC METABOLIC PANEL
BUN/Creatinine Ratio: 20 (ref 9–20)
BUN: 16 mg/dL (ref 6–24)
CO2: 26 mmol/L (ref 20–29)
Calcium: 9.3 mg/dL (ref 8.7–10.2)
Chloride: 102 mmol/L (ref 96–106)
Creatinine, Ser: 0.82 mg/dL (ref 0.76–1.27)
Glucose: 82 mg/dL (ref 70–99)
Potassium: 4.1 mmol/L (ref 3.5–5.2)
Sodium: 140 mmol/L (ref 134–144)
eGFR: 108 mL/min/{1.73_m2} (ref >59)

## 2022-01-03 LAB — H PYLORI, IGM, IGG, IGA AB
H pylori, IgM Abs: <9 units (ref 0.0–8.9)
H. pylori, IgA Abs: >35 units — ABNORMAL HIGH (ref 0.0–8.9)
H. pylori, IgG AbS: 0.07 Index Value (ref 0.00–0.79)

## 2022-01-07 MED ORDER — AMOXICILLIN 500 MG PO CAPS: 1000.0000 mg | ORAL_CAPSULE | Freq: Two times a day (BID) | ORAL | 0 refills | Status: AC

## 2022-01-07 MED ORDER — CLARITHROMYCIN 500 MG PO TABS
500.0000 mg | ORAL_TABLET | Freq: Two times a day (BID) | ORAL | 0 refills | Status: AC
Start: 2022-01-07 — End: 2022-01-21

## 2022-01-07 NOTE — Addendum Note (Signed)
Addended by: Roney Jaffe on: 01/07/2022 12:16 PM ? ? Modules accepted: Orders ? ?

## 2022-01-09 ENCOUNTER — Telehealth: Payer: Self-pay | Admitting: *Deleted

## 2022-01-09 NOTE — Telephone Encounter (Signed)
-----   Message from Roney Jaffe, [place] sent at 01/07/2022 12:16 PM EDT ----- ?Please call patient and let him know that his labs did show that he was positive for H. pylori, he will continue using the omeprazole 40 mg once daily, in addition he will also take clarithromycin and amoxicillin.  He has a follow-up scheduled with his primary care provider at the end of the month, he should keep that appointment for further evaluation.  Prescription sent to his pharmacy. ?

## 2022-01-09 NOTE — Telephone Encounter (Signed)
Medical Assistant used [employer] to contact patient.  ?Interpreter Name: Ellie Lunch #: [id number] ?Patient verified DOB ?Patient is aware of needing to complete the treatment for h pylori and to follow up with PCP on 5/30 after completing treatment. ?

## 2022-01-23 NOTE — Progress Notes (Signed)
Patient ID: Lonnie Holt, male    DOB: Oct 11, 1970  MRN: [id number]  CC: Acid Reflux Follow-Up  Subjective: Lonnie Holt is a 51 y.o. male who presents for acid reflux follow-up.   His concerns today include:  Last appointment 12/31/2021 with Carrolyn Meiers, PA. Prescribed Omeprazole for acid reflux and labs obtained.   Result note per PA: Please call patient and let him know that his labs did show that he was positive for H. pylori, he will continue using the omeprazole 40 mg once daily, in addition he will also take clarithromycin and amoxicillin.  He has a follow-up scheduled with his primary care provider at the end of the month, he should keep that appointment for further evaluation.  Prescription sent to his pharmacy.  Today reports completed course of antibiotics. Still taking daily acid reflux medication. Reports acid reflux symptoms comes and goes. Reports sometimes feels there is something stuck in his throat. Reports this is mild in nature. Reports shortly after when he swallows feels normal. Endorses occasional nausea and stomach pain. Denies red flag symptoms such as but not limited to chest pain, shortness of breath, vomiting, cough w/ and w/o blood, blood in stool, darker than normal stools.  Patient Active Problem List   Diagnosis Date Noted   Onychomycosis 11/11/2020   Acute right-sided low back pain with right-sided sciatica 11/01/2020   Redness of eye, left 05/16/2015   Acute pharyngitis 05/23/2011   Shoulder pain, right 01/08/2011   GASTROESOPHAGEAL REFLUX, NO ESOPHAGITIS 10/30/2006   BACK PAIN W/RADIATION, UNSPECIFIED 10/30/2006     Current Outpatient Medications on File Prior to Visit  Medication Sig Dispense Refill   gabapentin (NEURONTIN) 300 MG capsule Take 1 capsule (300 mg total) by mouth at bedtime. 30 capsule 2   No current facility-administered medications on file prior to visit.    No Known Allergies  Social History   Socioeconomic  History   Marital status: Married    Spouse name: Not on file   Number of children: Not on file   Years of education: Not on file   Highest education level: Not on file  Occupational History   Not on file  Tobacco Use   Smoking status: Never    Passive exposure: Never   Smokeless tobacco: Never  Vaping Use   Vaping Use: Never used  Substance and Sexual Activity   Alcohol use: No   Drug use: No   Sexual activity: Not on file  Other Topics Concern   Not on file  Social History Narrative   Not on file   Social Determinants of Health   Financial Resource Strain: Not on file  Food Insecurity: Not on file  Transportation Needs: Not on file  Physical Activity: Not on file  Stress: Not on file  Social Connections: Not on file  Intimate Partner Violence: Not on file    No family history on file.  No past surgical history on file.  ROS: Review of Systems Negative except as stated above  PHYSICAL EXAM: BP 120/84 (BP Location: Left Arm, Patient Position: Sitting, Cuff Size: Normal)   Pulse 80   Temp 98.3 F (36.8 C)   Resp 18   Ht 5' 6.54" (1.69 m)   Wt 180 lb (81.6 kg)   SpO2 95%   BMI 28.59 kg/m   Physical Exam HENT:     Head: Normocephalic and atraumatic.  Eyes:     Extraocular Movements: Extraocular movements intact.     Conjunctiva/sclera: Conjunctivae  normal.     Pupils: Pupils are equal, round, and reactive to light.  Cardiovascular:     Rate and Rhythm: Normal rate and regular rhythm.     Pulses: Normal pulses.     Heart sounds: Normal heart sounds.  Pulmonary:     Effort: Pulmonary effort is normal.     Breath sounds: Normal breath sounds.  Musculoskeletal:     Cervical back: Normal range of motion and neck supple.  Neurological:     General: No focal deficit present.     Mental Status: He is alert and oriented to person, place, and time.  Psychiatric:        Mood and Affect: Mood normal.    ASSESSMENT AND PLAN: 1. Gastroesophageal reflux  disease without esophagitis: - Continue Omeprazole as prescribed.  - Referral to Gastroenterology for further evaluation and management.  - Ambulatory referral to Gastroenterology - omeprazole (PRILOSEC) 40 MG capsule; Take 1 capsule (40 mg total) by mouth daily.  Dispense: 90 capsule; Refill: 1  2. H. pylori infection: - Patient completed recommended course of antibiotics.  - Referral to Gastroenterology for further evaluation and management.  - Ambulatory referral to Gastroenterology  3. Colon cancer screening: - Referral to Gastroenterology for further evaluation and management.  - Ambulatory referral to Gastroenterology  4. Language barrier: - Building control surveyor. Name: Harrell Gave ID#: [id number]    Patient was given the opportunity to ask questions.  Patient verbalized understanding of the plan and was able to repeat key elements of the plan. Patient was given clear instructions to go to Emergency Department or return to medical center if symptoms don't improve, worsen, or new problems develop.The patient verbalized understanding.   Orders Placed This Encounter  Procedures   Ambulatory referral to Gastroenterology     Requested Prescriptions   Signed Prescriptions Disp Refills   omeprazole (PRILOSEC) 40 MG capsule 90 capsule 1    Sig: Take 1 capsule (40 mg total) by mouth daily.    Return for Physical per patient preference.  Camillia Herter, NP

## 2022-01-29 ENCOUNTER — Encounter: Payer: Self-pay | Admitting: Family

## 2022-01-29 ENCOUNTER — Ambulatory Visit (INDEPENDENT_AMBULATORY_CARE_PROVIDER_SITE_OTHER): Payer: Self-pay | Admitting: Family

## 2022-01-29 VITALS — BP 120/84 | HR 80 | Temp 98.3°F | Resp 18 | Ht 66.54 in | Wt 180.0 lb

## 2022-01-29 DIAGNOSIS — Z789 Other specified health status: Secondary | ICD-10-CM

## 2022-01-29 DIAGNOSIS — A048 Other specified bacterial intestinal infections: Secondary | ICD-10-CM

## 2022-01-29 DIAGNOSIS — K219 Gastro-esophageal reflux disease without esophagitis: Secondary | ICD-10-CM

## 2022-01-29 DIAGNOSIS — Z1211 Encounter for screening for malignant neoplasm of colon: Secondary | ICD-10-CM

## 2022-01-29 MED ORDER — OMEPRAZOLE 40 MG PO CPDR: 40.0000 mg | DELAYED_RELEASE_CAPSULE | Freq: Every day | ORAL | 1 refills | Status: AC

## 2022-01-29 NOTE — Patient Instructions (Signed)
Infeccin por Helicobacter Pylori Helicobacter Pylori Infection La infeccin por Helicobacter pylori es una infeccin bacteriana en el estmago. Las infecciones a Barrister's clerk (crnicas) pueden causar irritacin en el estmago (gastritis), lceras en el estmago (lceras gstricas) y lceras en la parte superior del intestino (lceras duodenales). Tener esta infeccin tambin puede aumentar su riesgo de Chief Financial Officer de estmago y un tipo de cncer de los glbulos blancos (linfoma) que afecta al [employer]. Cules son las causas? Esta infeccin es causada por la bacteria Helicobacter pylori (H. pylori). Muchas personas saludables tienen esta bacteria en el revestimiento del estmago. Adems, la bacteria puede contagiarse de Ardelia Mems persona a otra por contacto a travs de la materia fecal (heces) o la saliva. No se sabe por qu algunas personas desarrollan lceras, gastritis o cncer a partir de la bacteria. Qu incrementa el riesgo? Es ms probable que tenga esta afeccin si: Tiene familiares con esta infeccin. Convive con [employer], como en un dormitorio. Cules son los signos o sntomas? [place] con esta infeccin no tienen sntomas. Si tiene sntomas, pueden incluir los siguientes: Merchant navy officer. Dolor de [place]. Nuseas. Vmitos. Puede presentar sangre en el vmito a causa de las lceras. Prdida del apetito. Mal aliento. Cmo se diagnostica? Esta afeccin se puede diagnosticar en funcin de lo siguiente: Los sntomas y los antecedentes mdicos. Un examen fsico. Anlisis de sangre. Pruebas de heces. Una prueba del aliento. Un procedimiento que consiste en colocarle un tubo con una cmara en el extremo por la garganta para examinarle el estmago y la parte superior del intestino (endoscopa alta). Extraccin y [place] de tejido del revestimiento del estmago (biopsia). Una biopsia puede realizarse durante una endoscopa alta. Cmo se  trata?  Esta afeccin se trata mediante una combinacin de medicamentos (terapia triple) durante varias semanas. La terapia triple incluye un medicamento para reducir la cantidad de cido en el estmago y [place] tipos de antibiticos. Este tratamiento puede reducir el riesgo de Chief Financial Officer. Despus del tratamiento, es posible que deba volver a realizarse una prueba de H. pylori. En algunos casos, es posible que el tratamiento se deba repetir si no ha eliminado todas las bacterias. Siga estas instrucciones en su casa:  Use los medicamentos de venta libre y los recetados solamente como se lo haya indicado el [place] antibitico como se lo haya indicado el mdico. No deje de tomar los antibiticos aunque comience a sentirse mejor. Retome sus actividades normales como se lo haya indicado el mdico. Pregntele al mdico qu actividades son seguras para usted. Tome estas medidas para evitar infecciones futuras: Lvese las manos frecuentemente con agua y jabn durante al menos 20 segundos. Use desinfectante para manos si no dispone de [place] y [place]. No coma alimentos ni beba agua que pueda haber estado en contacto con heces o saliva. Concurra a [place]. Esto es importante. Es posible que necesite realizarse estudios para comprobar que el tratamiento funcion. Comunquese con un mdico si sus sntomas: No mejoran con el tratamiento. Regresan despus del tratamiento. Resumen La infeccin por Helicobacter pylori es una infeccin estomacal causada por la bacteria Helicobacter pylori (H. pylori). Esta infeccin puede causar irritacin en el estmago (gastritis), lceras en el estmago (lceras gstricas) y lceras en la parte superior del intestino (lceras duodenales). Esta afeccin se trata mediante una combinacin de medicamentos (terapia triple) durante varias semanas. Tome el antibitico como se lo haya indicado el mdico. No deje de tomar los antibiticos [place]  comience  a sentirse mejor. Esta informacin no tiene Marine scientist el consejo del mdico. Asegrese de hacerle al mdico cualquier pregunta que tenga. Document Revised: 03/29/2021 Document Reviewed: 03/29/2021 Elsevier Patient Education  [place].

## 2022-01-29 NOTE — Progress Notes (Signed)
Pt presents for acid reflux and H. Pylori f/u

## 2022-02-22 NOTE — Progress Notes (Signed)
Erroneous encounter-disregard

## 2022-02-28 ENCOUNTER — Encounter: Payer: Self-pay | Admitting: Family

## 2022-02-28 DIAGNOSIS — Z0000 Encounter for general adult medical examination without abnormal findings: Secondary | ICD-10-CM

## 2022-02-28 DIAGNOSIS — Z789 Other specified health status: Secondary | ICD-10-CM

## 2022-02-28 DIAGNOSIS — Z131 Encounter for screening for diabetes mellitus: Secondary | ICD-10-CM

## 2022-02-28 DIAGNOSIS — Z1329 Encounter for screening for other suspected endocrine disorder: Secondary | ICD-10-CM

## 2022-03-11 NOTE — Progress Notes (Addendum)
Patient ID: Lonnie Holt, male    DOB: 07/01/1971  MRN: [id number]  CC: Annual Physical Exam  Subjective: Lonnie Holt is a 51 y.o. male who presents for annual physical exam.   His concerns today include:  Bilateral lower arm skin tags. Denies pain and additional red flag symptoms. Present for several months. Reports ok with Dermatology referral to pay out of pocket.   Patient Active Problem List   Diagnosis Date Noted   Onychomycosis 11/11/2020   Acute right-sided low back pain with right-sided sciatica 11/01/2020   Redness of eye, left 05/16/2015   Acute pharyngitis 05/23/2011   Shoulder pain, right 01/08/2011   GASTROESOPHAGEAL REFLUX, NO ESOPHAGITIS 10/30/2006   BACK PAIN W/RADIATION, UNSPECIFIED 10/30/2006     Current Outpatient Medications on File Prior to Visit  Medication Sig Dispense Refill   gabapentin (NEURONTIN) 300 MG capsule Take 1 capsule (300 mg total) by mouth at bedtime. 30 capsule 2   omeprazole (PRILOSEC) 40 MG capsule Take 1 capsule (40 mg total) by mouth daily. 90 capsule 1   No current facility-administered medications on file prior to visit.    No Known Allergies  Social History   Socioeconomic History   Marital status: Married    Spouse name: Not on file   Number of children: Not on file   Years of education: Not on file   Highest education level: Not on file  Occupational History   Not on file  Tobacco Use   Smoking status: Never    Passive exposure: Never   Smokeless tobacco: Never  Vaping Use   Vaping Use: Never used  Substance and Sexual Activity   Alcohol use: No   Drug use: No   Sexual activity: Not on file  Other Topics Concern   Not on file  Social History Narrative   Not on file   Social Determinants of Health   Financial Resource Strain: Not on file  Food Insecurity: Not on file  Transportation Needs: Not on file  Physical Activity: Not on file  Stress: Not on file  Social Connections: Not on file   Intimate Partner Violence: Not on file    No family history on file.  No past surgical history on file.  ROS: Review of Systems Negative except as stated above  PHYSICAL EXAM: BP 129/89 (BP Location: Left Arm, Patient Position: Sitting, Cuff Size: Normal)   Pulse 73   Temp 98.3 F (36.8 C)   Resp 16   Ht 5' 4.53" (1.639 m)   Wt 177 lb (80.3 kg)   SpO2 94%   BMI 29.89 kg/m   Physical Exam HENT:     Head: Normocephalic and atraumatic.     Right Ear: Tympanic membrane, ear canal and external ear normal.     Left Ear: Tympanic membrane, ear canal and external ear normal.     Nose: Nose normal.     Mouth/Throat:     Mouth: Mucous membranes are moist.     Pharynx: Oropharynx is clear.  Eyes:     Extraocular Movements: Extraocular movements intact.     Conjunctiva/sclera: Conjunctivae normal.     Pupils: Pupils are equal, round, and reactive to light.  Cardiovascular:     Rate and Rhythm: Normal rate and regular rhythm.     Pulses: Normal pulses.     Heart sounds: Normal heart sounds.  Pulmonary:     Effort: Pulmonary effort is normal.     Breath sounds: Normal breath sounds.  Abdominal:     General: Bowel sounds are normal.     Palpations: Abdomen is soft.  Genitourinary:    Comments: Patient declined.  Musculoskeletal:        General: Normal range of motion.     Right shoulder: Normal.     Left shoulder: Normal.     Right upper arm: Normal.     Left upper arm: Normal.     Right elbow: Normal.     Left elbow: Normal.     Right forearm: Normal.     Left forearm: Normal.     Right wrist: Normal.     Left wrist: Normal.     Right hand: Normal.     Left hand: Normal.     Cervical back: Normal, normal range of motion and neck supple.     Thoracic back: Normal.     Lumbar back: Normal.     Right hip: Normal.     Left hip: Normal.     Right upper leg: Normal.     Left upper leg: Normal.     Right knee: Normal.     Left knee: Normal.     Right lower leg:  Normal.     Left lower leg: Normal.     Right ankle: Normal.     Left ankle: Normal.     Right foot: Normal.     Left foot: Normal.  Skin:    General: Skin is warm and dry.     Capillary Refill: Capillary refill takes less than 2 seconds.  Neurological:     General: No focal deficit present.     Mental Status: He is alert and oriented to person, place, and time.  Psychiatric:        Mood and Affect: Mood normal.        Behavior: Behavior normal.     ASSESSMENT AND PLAN: 1. Annual physical exam - Counseled on 150 minutes of exercise per week as tolerated, healthy eating (including decreased daily intake of saturated fats, cholesterol, added sugars, sodium), STI prevention, and routine healthcare maintenance.  2. Screening for metabolic disorder - Screening liver function.  - Hepatic Function Panel  3. Screening for deficiency anemia - CBC to screen for anemia. - CBC  4. Diabetes mellitus screening - Hemoglobin A1c to screen for pre-diabetes/diabetes. - Hemoglobin A1c  5. Screening cholesterol level - Lipid panel to screen for high cholesterol.  - Lipid panel  6. Thyroid disorder screen - TSH to check thyroid function.  - TSH  7. Colon cancer screening - Screening kit. - Fecal occult blood, imunochemical([facility])  8. Skin tag - Per patient preference referral to Dermatology for further evaluation and management.  - Ambulatory referral to Dermatology  9. Language barrier - Building control surveyor. Name: Lonnie Holt  ID#: [id number]    Patient was given the opportunity to ask questions.  Patient verbalized understanding of the plan and was able to repeat key elements of the plan. Patient was given clear instructions to go to Emergency Department or return to medical center if symptoms don't improve, worsen, or new problems develop.The patient verbalized understanding.   Orders Placed This Encounter  Procedures   Fecal occult blood, imunochemical([facility])    CBC   Lipid panel   TSH   Hemoglobin A1c   Hepatic Function Panel   Ambulatory referral to Dermatology    Return in about 1 year (around 03/20/2023) for Physical per patient preference.  Camillia Herter, NP

## 2022-03-19 ENCOUNTER — Ambulatory Visit (INDEPENDENT_AMBULATORY_CARE_PROVIDER_SITE_OTHER): Payer: Self-pay | Admitting: Family

## 2022-03-19 VITALS — BP 129/89 | HR 73 | Temp 98.3°F | Resp 16 | Ht 64.53 in | Wt 177.0 lb

## 2022-03-19 DIAGNOSIS — Z8619 Personal history of other infectious and parasitic diseases: Secondary | ICD-10-CM

## 2022-03-19 DIAGNOSIS — Z789 Other specified health status: Secondary | ICD-10-CM

## 2022-03-19 DIAGNOSIS — Z13228 Encounter for screening for other metabolic disorders: Secondary | ICD-10-CM

## 2022-03-19 DIAGNOSIS — Z13220 Encounter for screening for lipoid disorders: Secondary | ICD-10-CM

## 2022-03-19 DIAGNOSIS — K219 Gastro-esophageal reflux disease without esophagitis: Secondary | ICD-10-CM

## 2022-03-19 DIAGNOSIS — Z1329 Encounter for screening for other suspected endocrine disorder: Secondary | ICD-10-CM

## 2022-03-19 DIAGNOSIS — Z0000 Encounter for general adult medical examination without abnormal findings: Secondary | ICD-10-CM

## 2022-03-19 DIAGNOSIS — Z130 Encounter for screening for diseases of the blood and blood-forming organs and certain disorders involving the immune mechanism: Secondary | ICD-10-CM

## 2022-03-19 DIAGNOSIS — L918 Other hypertrophic disorders of the skin: Secondary | ICD-10-CM

## 2022-03-19 DIAGNOSIS — Z131 Encounter for screening for diabetes mellitus: Secondary | ICD-10-CM

## 2022-03-19 DIAGNOSIS — Z1211 Encounter for screening for malignant neoplasm of colon: Secondary | ICD-10-CM

## 2022-03-19 NOTE — Progress Notes (Signed)
.  Pt presents for annual physical exam  

## 2022-03-19 NOTE — Patient Instructions (Addendum)
Cuidados preventivos en los hombres de 40 a 64 aos de edad Preventive Care 65-49 Years Old, Male Los cuidados preventivos hacen referencia a las opciones en cuanto al estilo de vida y a las visitas al mdico, las cuales pueden promover la salud y Counsellor. Las visitas de cuidado preventivo tambin se denominan exmenes de Health visitor. Qu puedo esperar para mi visita de cuidado preventivo? Asesoramiento Durante la visita de cuidado preventivo, el mdico puede preguntarle sobre lo siguiente: Antecedentes mdicos, incluidos los siguientes: Problemas mdicos pasados. Antecedentes mdicos familiares. Salud actual, incluido lo siguiente: Su bienestar emocional. Training and development officer y las relaciones personales. Su actividad sexual. Estilo de vida, incluido lo siguiente: Consumo de alcohol, nicotina, tabaco o drogas. Acceso a armas de fuego. Hbitos de alimentacin, ejercicio y sueo. Cuestiones de seguridad, como el uso de cinturn de seguridad y casco de Scientist, research (physical sciences). Uso de pantalla solar. Su trabajo y [place] laboral. Examen fsico El mdico revisar lo siguiente: Diplomatic Services operational officer y [place]. Estos pueden usarse para calcular el IMC (ndice de masa corporal). El [facility] es una medicin que indica si tiene un peso saludable. Circunferencia de la cintura. Es Neomia Dear medicin alrededor de Lobbyist. Esta medicin tambin indica si tiene un peso saludable y puede ayudar a predecir su riesgo de padecer ciertas enfermedades, como diabetes tipo 2 y presin arterial alta. Frecuencia cardaca y presin arterial. Temperatura corporal. Piel para detectar manchas anormales. Qu vacunas necesito?  Las vacunas se aplican a varias edades, segn un cronograma. El [employer] recomendar vacunas segn su edad, sus antecedentes mdicos, su estilo de vida y [address], como los viajes o el lugar donde trabaja. Qu pruebas necesito? Pruebas de deteccin El mdico puede recomendar pruebas de deteccin de ciertas  afecciones. Esto puede incluir: Niveles de lpidos y colesterol. Pruebas de deteccin de la diabetes. Esto se Physiological scientist un control del azcar en la sangre (glucosa) despus de no haber comido durante un periodo de tiempo (ayuno). Prueba de hepatitis B. Prueba de hepatitis C. Prueba del VIH (virus de inmunodeficiencia humana). Pruebas de infecciones de transmisin sexual (ITS), si est en riesgo. Pruebas de deteccin de cncer de pulmn. Examen de deteccin del cncer de prstata. Pruebas de deteccin de Building services engineer. Hable con su mdico sobre los [employer], las opciones de tratamiento y, si corresponde, la necesidad de Education officer, environmental ms pruebas. Siga estas instrucciones en su casa: Comida y bebida  Siga una dieta que incluya frutas y verduras frescas, cereales integrales, protenas magras y productos lcteos descremados. Tome los suplementos vitamnicos y [employer] se lo haya indicado el mdico. No beba alcohol si el mdico se lo prohbe. Si bebe alcohol: Limite la cantidad que consume de 0 a 2 medidas por da. Sepa cunta cantidad de alcohol hay en las bebidas que toma. En los [address], una medida equivale a una botella de cerveza de 12 oz (355 ml), un vaso de vino de 5 oz (148 ml) o un vaso de una bebida alcohlica de alta graduacin de 1 oz (44 ml). Estilo de [employer] dientes a la maana y a la noche con Conservator, museum/gallery con fluoruro. Use hilo dental una vez al da. Haga al menos 30 minutos de ejercicio, 5 o ms [address]. No consuma ningn producto que contenga nicotina o tabaco. Estos productos incluyen cigarrillos, tabaco para Theatre manager y aparatos de vapeo, como los Administrator, Civil Service. Si necesita ayuda para dejar de fumar, consulte al mdico. No consuma drogas. Si es sexualmente [place],  practique sexo seguro. Use un condn u otra forma de proteccin para prevenir las infecciones de transmisin sexual (ITS). Tome aspirina nicamente  como se lo haya indicado el mdico. Asegrese de que comprende qu cantidad y cul presentacin debe tomar. Trabaje con el mdico para averiguar si es seguro y beneficioso para usted tomar aspirina a diario. Busque maneras saludables de controlar el estrs, tales como: Meditacin, yoga o escuchar msica. Lleve un diario personal. Hable con una persona confiable. Pase tiempo con amigos y familiares. Minimice la exposicin a la radiacin UV para reducir el riesgo de cncer de piel. Seguridad [place] siempre el cinturn de seguridad al conducir o viajar en un vehculo. No conduzca: Si ha estado bebiendo alcohol. No viaje con un conductor que ha estado bebiendo. Si est cansado o distrado. Mientras est enviando mensajes de texto. Si ha estado usando sustancias o drogas que alteran la funcin mental. Use un casco y otros equipos de proteccin durante las actividades deportivas. Si tiene armas de fuego en su casa, asegrese de seguir todos los procedimientos de seguridad correspondientes. Cundo volver? Acuda al mdico una vez al ao para una visita anual de control de bienestar. Pregntele al mdico con qu frecuencia debe realizarse un control de la vista y los dientes. Mantenga su esquema de vacunacin al da. Esta informacin no tiene como fin reemplazar el consejo del mdico. Asegrese de hacerle al mdico cualquier pregunta que tenga. Document Revised: 03/08/2021 Document Reviewed: 03/08/2021 Elsevier Patient Education  2023 Elsevier Inc.  

## 2022-03-19 NOTE — Addendum Note (Signed)
Addended by: Rema Fendt on: 03/19/2022 09:31 AM   Modules accepted: Orders

## 2022-03-20 ENCOUNTER — Other Ambulatory Visit: Payer: Self-pay | Admitting: Family

## 2022-03-20 DIAGNOSIS — Z13228 Encounter for screening for other metabolic disorders: Secondary | ICD-10-CM

## 2022-03-20 DIAGNOSIS — R7303 Prediabetes: Secondary | ICD-10-CM | POA: Insufficient documentation

## 2022-03-20 LAB — CBC
Hematocrit: 45.7 % (ref 37.5–51.0)
Hemoglobin: 15.5 g/dL (ref 13.0–17.7)
MCH: 29.8 pg (ref 26.6–33.0)
MCHC: 33.9 g/dL (ref 31.5–35.7)
MCV: 88 fL (ref 79–97)
Platelets: 365 10*3/uL (ref 150–450)
RBC: 5.2 x10E6/uL (ref 4.14–5.80)
RDW: 12.5 % (ref 11.6–15.4)
WBC: 6.2 10*3/uL (ref 3.4–10.8)

## 2022-03-20 LAB — LIPID PANEL
Chol/HDL Ratio: 4.6 ratio (ref 0.0–5.0)
Cholesterol, Total: 221 mg/dL — ABNORMAL HIGH (ref 100–199)
HDL: 48 mg/dL (ref >39)
LDL Chol Calc (NIH): 148 mg/dL — ABNORMAL HIGH (ref 0–99)
Triglycerides: 141 mg/dL (ref 0–149)
VLDL Cholesterol Cal: 25 mg/dL (ref 5–40)

## 2022-03-20 LAB — HEPATIC FUNCTION PANEL
ALT: 49 IU/L — ABNORMAL HIGH (ref 0–44)
AST: 24 IU/L (ref 0–40)
Albumin: 4.3 g/dL (ref 4.1–5.1)
Alkaline Phosphatase: 91 IU/L (ref 44–121)
Bilirubin Total: 0.4 mg/dL (ref 0.0–1.2)
Bilirubin, Direct: <0.1 mg/dL (ref 0.00–0.40)
Total Protein: 6.6 g/dL (ref 6.0–8.5)

## 2022-03-20 LAB — HEMOGLOBIN A1C
Est. average glucose Bld gHb Est-mCnc: 120 mg/dL
Hgb A1c MFr Bld: 5.8 % — ABNORMAL HIGH (ref 4.8–5.6)

## 2022-03-20 LAB — TSH: TSH: 2.56 u[IU]/mL (ref 0.450–4.500)

## 2022-03-22 LAB — FECAL OCCULT BLOOD, IMMUNOCHEMICAL: Fecal Occult Bld: NEGATIVE

## 2022-03-27 ENCOUNTER — Telehealth: Payer: Self-pay | Admitting: Family

## 2022-03-27 NOTE — Telephone Encounter (Signed)
Copied from CRM [id number]. Topic: General - Other >> Mar 27, 2022 12:13 PM Lonnie Holt wrote: Reason for CRM: The patient has returned missed from call from practice regarding their lab results   Please contact further when possible

## 2022-04-16 ENCOUNTER — Encounter: Payer: Self-pay | Admitting: Family

## 2022-05-01 ENCOUNTER — Other Ambulatory Visit: Payer: Self-pay

## 2022-05-01 DIAGNOSIS — Z13228 Encounter for screening for other metabolic disorders: Secondary | ICD-10-CM

## 2022-05-01 NOTE — Progress Notes (Signed)
ALT collected left arm

## 2022-05-02 LAB — ALT: ALT: 40 IU/L (ref 0–44)

## 2022-05-22 ENCOUNTER — Telehealth: Payer: Self-pay | Admitting: *Deleted

## 2022-05-22 NOTE — Telephone Encounter (Signed)
Camillia Herter, NP  05/02/2022  7:44 AM EDT     Call patient with update.    Liver function back to normal.   Results and provider comments given via interpreter. Questions asked, meds discussed. Pt states abdominal pain back when omeprazole ran out. Told him there is a refill at pharmacy. He will get that and was advised if pain does not go away again to call back. Verbalized understanding.

## 2022-05-24 ENCOUNTER — Telehealth: Payer: Self-pay

## 2022-05-24 NOTE — Telephone Encounter (Signed)
Copied from [place] [id number]. Topic: General - Other >> May 20, 2022  2:42 PM Ja-Kwan M wrote: Reason for CRM: Pt requests call back to go over most recent lab results. Pt will need Spanish interpreter. Cb# [phone]  Att to contact pt using ([place] #[id number]) to advise of lab results using no ans lvm If pt returns call please advise of lab results     Camillia Herter, NP  05/02/2022  7:44 AM EDT     Call patient with update.    Liver function back to normal.

## 2023-08-15 LAB — AMB RESULTS CONSOLE CBG: Glucose: 138

## 2024-01-29 ENCOUNTER — Encounter: Payer: Self-pay | Admitting: Family

## 2024-01-29 ENCOUNTER — Ambulatory Visit (INDEPENDENT_AMBULATORY_CARE_PROVIDER_SITE_OTHER): Payer: Self-pay | Admitting: Family

## 2024-01-29 VITALS — BP 123/82 | HR 67 | Temp 98.2°F | Resp 16 | Ht 67.0 in | Wt 177.6 lb

## 2024-01-29 DIAGNOSIS — Z603 Acculturation difficulty: Secondary | ICD-10-CM

## 2024-01-29 DIAGNOSIS — Z758 Other problems related to medical facilities and other health care: Secondary | ICD-10-CM

## 2024-01-29 DIAGNOSIS — M25421 Effusion, right elbow: Secondary | ICD-10-CM

## 2024-01-29 MED ORDER — TRIAMCINOLONE ACETONIDE 40 MG/ML IJ SUSP
40.0000 mg | Freq: Once | INTRAMUSCULAR | Status: AC
  Administered 2024-01-29: 40 mg via INTRAMUSCULAR

## 2024-01-29 MED ORDER — AMOXICILLIN-POT CLAVULANATE 875-125 MG PO TABS: 1.0000 | ORAL_TABLET | Freq: Two times a day (BID) | ORAL | 0 refills | Status: AC

## 2024-01-29 NOTE — Progress Notes (Signed)
 Patient ID: Lonnie Holt, male    DOB: 1971/04/23  MRN: [id number]  CC: Elbow Pain  Subjective: Lonnie Holt is a 53 y.o. male who presents for elbow pain.   His concerns today include:  States right elbow swelling and "burning" for 1 week. Denies recent trauma/injury and red flag symptoms. Denies pain. States began after he was "leaning on a table and felt like he was pressing on a tendon".  Patient Active Problem List   Diagnosis Date Noted   Prediabetes 03/20/2022   Onychomycosis 11/11/2020   Acute right-sided low back pain with right-sided sciatica 11/01/2020   Redness of eye, left 05/16/2015   Acute pharyngitis 05/23/2011   Shoulder pain, right 01/08/2011   GASTROESOPHAGEAL REFLUX, NO ESOPHAGITIS 10/30/2006   BACK PAIN W/RADIATION, UNSPECIFIED 10/30/2006     Current Outpatient Medications on File Prior to Visit  Medication Sig Dispense Refill   gabapentin  (NEURONTIN ) 300 MG capsule Take 1 capsule (300 mg total) by mouth at bedtime. (Patient not taking: Reported on 01/29/2024) 30 capsule 2   omeprazole  (PRILOSEC) 40 MG capsule Take 1 capsule (40 mg total) by mouth daily. 90 capsule 1   No current facility-administered medications on file prior to visit.    No Known Allergies  Social History   Socioeconomic History   Marital status: Married    Spouse name: Not on file   Number of children: Not on file   Years of education: Not on file   Highest education level: Not on file  Occupational History   Not on file  Tobacco Use   Smoking status: Never    Passive exposure: Never   Smokeless tobacco: Never  Vaping Use   Vaping status: Never Used  Substance and Sexual Activity   Alcohol use: No   Drug use: No   Sexual activity: Yes    Birth control/protection: None  Other Topics Concern   Not on file  Social History Narrative   Not on file   Social Drivers of Health   Financial Resource Strain: Low Risk  (01/29/2024)   Overall Financial Resource  Strain (CARDIA)    Difficulty of Paying Living Expenses: Not hard at all  Food Insecurity: No Food Insecurity (01/29/2024)   Hunger Vital Sign    Worried About Running Out of Food in the Last Year: Never true    Ran Out of Food in the Last Year: Never true  Transportation Needs: No Transportation Needs (01/29/2024)   PRAPARE - Administrator, Civil Service (Medical): No    Lack of Transportation (Non-Medical): No  Physical Activity: Insufficiently Active (01/29/2024)   Exercise Vital Sign    Days of Exercise per Week: 4 days    Minutes of Exercise per Session: 30 min  Stress: No Stress Concern Present (01/29/2024)   [employer] of Occupational Health - Occupational Stress Questionnaire    Feeling of Stress : Not at all  Social Connections: Moderately Integrated (01/29/2024)   Social Connection and Isolation Panel [NHANES]    Frequency of Communication with Friends and Family: More than three times a week    Frequency of Social Gatherings with Friends and Family: More than three times a week    Attends Religious Services: More than 4 times per year    Active Member of [employer] or Organizations: No    Attends Banker Meetings: Never    Marital Status: Married  Catering manager Violence: Not At Risk (01/29/2024)   Humiliation, Afraid, Rape, and  Kick questionnaire    Fear of Current or Ex-Partner: No    Emotionally Abused: No    Physically Abused: No    Sexually Abused: No    History reviewed. No pertinent family history.  History reviewed. No pertinent surgical history.  ROS: Review of Systems Negative except as stated above  PHYSICAL EXAM: BP 123/82   Pulse 67   Temp 98.2 F (36.8 C) (Oral)   Resp 16   Ht 5\' 7"  (1.702 m)   Wt 177 lb 9.6 oz (80.6 kg)   SpO2 96%   BMI 27.82 kg/m   Physical Exam HENT:     Head: Normocephalic and atraumatic.     Nose: Nose normal.     Mouth/Throat:     Mouth: Mucous membranes are moist.     Pharynx: Oropharynx  is clear.  Eyes:     Extraocular Movements: Extraocular movements intact.     Conjunctiva/sclera: Conjunctivae normal.     Pupils: Pupils are equal, round, and reactive to light.  Cardiovascular:     Rate and Rhythm: Normal rate and regular rhythm.     Pulses: Normal pulses.     Heart sounds: Normal heart sounds.  Pulmonary:     Effort: Pulmonary effort is normal.     Breath sounds: Normal breath sounds.  Musculoskeletal:        General: Normal range of motion.     Right shoulder: Normal.     Left shoulder: Normal.     Right upper arm: Normal.     Left upper arm: Normal.     Right elbow: Swelling present.     Left elbow: Normal.     Right forearm: Normal.     Left forearm: Normal.     Right wrist: Normal.     Left wrist: Normal.     Right hand: Normal.     Left hand: Normal.     Cervical back: Normal range of motion and neck supple.  Neurological:     General: No focal deficit present.     Mental Status: He is alert and oriented to person, place, and time.  Psychiatric:        Mood and Affect: Mood normal.        Behavior: Behavior normal.     ASSESSMENT AND PLAN: 1. Swelling of right elbow (Primary) - Triamcinolone Acetonide injection administered in office.  - Amoxicillin -Clavulanate as prescribed. Counseled on medication adherence/adverse effects. - Referral to Orthopedic Surgery for evaluation/management.  - Follow-up with primary provider as scheduled. - Ambulatory referral to Orthopedic Surgery - triamcinolone acetonide (KENALOG-40) injection 40 mg - amoxicillin -clavulanate (AUGMENTIN) 875-125 MG tablet; Take 1 tablet by mouth 2 (two) times daily.  Dispense: 20 tablet; Refill: 0 - Uric Acid  3. Language barrier - [employer]. ID#: [id number]   Patient was given the opportunity to ask questions.  Patient verbalized understanding of the plan and was able to repeat key elements of the plan. Patient was given clear instructions to go to Emergency Department  or return to medical center if symptoms don't improve, worsen, or new problems develop.The patient verbalized understanding.   Orders Placed This Encounter  Procedures   Uric Acid   Ambulatory referral to Orthopedic Surgery     Requested Prescriptions   Signed Prescriptions Disp Refills   amoxicillin -clavulanate (AUGMENTIN) 875-125 MG tablet 20 tablet 0    Sig: Take 1 tablet by mouth 2 (two) times daily.    Follow-up with primary provider as scheduled.  Senaida Dama, NP

## 2024-01-29 NOTE — Progress Notes (Signed)
 Right elbow swelling and warmness, a little pain

## 2024-01-30 ENCOUNTER — Ambulatory Visit: Payer: Self-pay | Admitting: Family

## 2024-01-30 LAB — URIC ACID: Uric Acid: 4.6 mg/dL (ref 3.8–8.4)

## 2024-02-09 ENCOUNTER — Ambulatory Visit: Payer: Self-pay | Admitting: Physician Assistant

## 2024-02-19 ENCOUNTER — Other Ambulatory Visit: Payer: Self-pay

## 2024-02-19 ENCOUNTER — Encounter: Payer: Self-pay | Admitting: Physician Assistant

## 2024-02-19 ENCOUNTER — Ambulatory Visit (INDEPENDENT_AMBULATORY_CARE_PROVIDER_SITE_OTHER): Payer: Self-pay | Admitting: Physician Assistant

## 2024-02-19 DIAGNOSIS — M7021 Olecranon bursitis, right elbow: Secondary | ICD-10-CM

## 2024-02-19 DIAGNOSIS — M25521 Pain in right elbow: Secondary | ICD-10-CM

## 2024-02-19 MED ORDER — LIDOCAINE HCL 1 % IJ SOLN
4.0000 mL | INTRAMUSCULAR | Status: AC | PRN
  Administered 2024-02-19: 4 mL

## 2024-02-19 MED ORDER — METHYLPREDNISOLONE ACETATE 40 MG/ML IJ SUSP
40.0000 mg | INTRAMUSCULAR | Status: AC | PRN
  Administered 2024-02-19: 40 mg via INTRA_ARTICULAR

## 2024-02-19 NOTE — Progress Notes (Signed)
 Office Visit Note   Patient: Lonnie Holt           Date of Birth: [birth year]           MRN: [id number] Visit Date: 02/19/2024              Requested by: Senaida Dama, NP [address] [place],  [place] [address] PCP: Senaida Dama, NP   Assessment & Plan: Visit Diagnoses:  1. Olecranon bursitis, right elbow     Plan: Recommended aspiration and injection with cortisone.  Patient was agreeable.  After aspiration injection the elbow was wrapped with an Ace bandage.  He will leave this on until tomorrow evening and then take this off.  He will follow-up with us  as needed.  I did discuss with him using the interpreter that he should keep pressure off the elbow is much as possible.  Follow-Up Instructions: Return if symptoms worsen or fail to improve.   Orders:  Orders Placed This Encounter  Procedures   XR Elbow Complete Right (3+View)   No orders of the defined types were placed in this encounter.     Procedures: Medium Joint Inj on 02/19/2024 3:39 PM Medications: 4 mL lidocaine 1 %; 40 mg methylPREDNISolone  acetate 40 MG/ML Aspirate: 4 mL bloody Consent was given by the patient. Immediately prior to procedure a time out was called to verify the correct patient, procedure, equipment, support staff and site/side marked as required. Patient was prepped and draped in the usual sterile fashion.       Clinical Data: No additional findings.   Subjective: Chief Complaint  Patient presents with   Right Elbow - Pain, Edema    HPI: Lonnie Holt is a 53 year old male were seen for the first time for right elbow pain.  He notes he has had a month of pain and swelling in the posterior aspect of the right elbow.  He was seen by his primary care physician who gave him some antibiotics for a week which was helpful.  Also gave him some type of anti-inflammatory injection but not an injection or aspiration of the elbow.  He has pain with pressure on the elbow.  He denies  any fevers chills.  Denies any injury.  No numbness tingling down the arm.  Patient seen with an interpreter as he speaks [place].  Review of Systems:See HPI otherwise negative   Objective: Vital Signs: There were no vitals taken for this visit.  Physical Exam Physical exam: General Well-developed well-nourished pleasant male in no acute distress. Respirations: Unlabored Right elbow good range of motion of the elbow.  Full supination pronation of the hand.  There is no rashes skin lesions ulcerations.  Soft tissue swelling but no significant erythema over the olecranon region.  Fluid accumulation and thickening of the olecranon bursa consistent with olecranon bursitis.   Specialty Comments:  No specialty comments available.  Imaging: XR Elbow Complete Right (3+View) Result Date: 02/19/2024 Right elbow 3 views: Elbow is well located.  No significant arthritic changes.  No osteophytes.  No bony abnormalities.  Slight soft tissue swelling posterior aspect of the elbow.    PMFS History: Patient Active Problem List   Diagnosis Date Noted   Prediabetes 03/20/2022   Onychomycosis 11/11/2020   Acute right-sided low back pain with right-sided sciatica 11/01/2020   Redness of eye, left 05/16/2015   Acute pharyngitis 05/23/2011   Shoulder pain, right 01/08/2011   GASTROESOPHAGEAL REFLUX, NO ESOPHAGITIS 10/30/2006   BACK PAIN [place],  UNSPECIFIED 10/30/2006   Past Medical History:  Diagnosis Date   GERD (gastroesophageal reflux disease)     No family history on file.  No past surgical history on file. Social History   Occupational History   Not on file  Tobacco Use   Smoking status: Never    Passive exposure: Never   Smokeless tobacco: Never  Vaping Use   Vaping status: Never Used  Substance and Sexual Activity   Alcohol use: No   Drug use: No   Sexual activity: Yes    Birth control/protection: None

## 2024-04-20 ENCOUNTER — Ambulatory Visit
Admission: EM | Admit: 2024-04-20 | Discharge: 2024-04-20 | Disposition: A | Discharge status: Home / Self Care | Payer: Self-pay | Attending: Physician Assistant | Admitting: Physician Assistant

## 2024-04-20 ENCOUNTER — Other Ambulatory Visit: Payer: Self-pay

## 2024-04-20 ENCOUNTER — Encounter: Payer: Self-pay | Admitting: Emergency Medicine

## 2024-04-20 DIAGNOSIS — L509 Urticaria, unspecified: Secondary | ICD-10-CM

## 2024-04-20 MED ORDER — CETIRIZINE HCL 5 MG/5ML PO SOLN
10.0000 mg | Freq: Every day | ORAL | Status: DC
  Administered 2024-04-20: 10 mg via ORAL

## 2024-04-20 MED ORDER — METHYLPREDNISOLONE SODIUM SUCC 125 MG IJ SOLR
125.0000 mg | Freq: Once | INTRAMUSCULAR | Status: AC
  Administered 2024-04-20: 125 mg via INTRAMUSCULAR

## 2024-04-20 MED ORDER — PREDNISONE 20 MG PO TABS: 40.0000 mg | ORAL_TABLET | Freq: Every day | ORAL | 0 refills | Status: AC

## 2024-04-20 NOTE — ED Triage Notes (Signed)
 Pt sts generalized hives x 4 days with itching and rash; unsure of cause

## 2024-04-20 NOTE — ED Provider Notes (Signed)
 [facility]    CSN: [id number] Arrival date & time: 04/20/24  9081      History   Chief Complaint Chief Complaint  Patient presents with   Urticaria    HPI Lonnie Holt is a 53 y.o. male.   Patient here today for evaluation of generalized hives that he has had for 4 days.  He reports he does have itching.  He is unsure of cause.  He denies any new exposures that he is aware of.  He has not had any shortness of breath.  The history is provided by the patient.  Urticaria Pertinent negatives include no shortness of breath.    Past Medical History:  Diagnosis Date   GERD (gastroesophageal reflux disease)     Patient Active Problem List   Diagnosis Date Noted   Prediabetes 03/20/2022   Onychomycosis 11/11/2020   Acute right-sided low back pain with right-sided sciatica 11/01/2020   Redness of eye, left 05/16/2015   Acute pharyngitis 05/23/2011   Shoulder pain, right 01/08/2011   GASTROESOPHAGEAL REFLUX, NO ESOPHAGITIS 10/30/2006   BACK PAIN W/RADIATION, UNSPECIFIED 10/30/2006    History reviewed. No pertinent surgical history.     Home Medications    Prior to Admission medications   Medication Sig Start Date End Date Taking? Authorizing Provider  predniSONE  (DELTASONE ) 20 MG tablet Take 2 tablets (40 mg total) by mouth daily with breakfast for 5 days. 04/20/24 04/25/24 Yes Billy Asberry FALCON, PA-C  amoxicillin -clavulanate (AUGMENTIN ) 875-125 MG tablet Take 1 tablet by mouth 2 (two) times daily. Patient not taking: Reported on 04/20/2024 01/29/24   Lorren Greig PARAS, NP  gabapentin  (NEURONTIN ) 300 MG capsule Take 1 capsule (300 mg total) by mouth at bedtime. Patient not taking: Reported on 01/29/2024 01/19/21   Lorren Greig PARAS, NP  omeprazole  (PRILOSEC) 40 MG capsule Take 1 capsule (40 mg total) by mouth daily. 01/29/22 07/28/22  Lorren Greig PARAS, NP    Family History History reviewed. No pertinent family history.  Social History Social History    Tobacco Use   Smoking status: Never    Passive exposure: Never   Smokeless tobacco: Never  Vaping Use   Vaping status: Never Used  Substance Use Topics   Alcohol use: No   Drug use: No     Allergies   Patient has no known allergies.   Review of Systems Review of Systems  Constitutional:  Negative for chills and fever.  Eyes:  Negative for discharge and redness.  Respiratory:  Negative for shortness of breath.   Skin:  Positive for rash.  Neurological:  Negative for numbness.     Physical Exam Triage Vital Signs ED Triage Vitals [04/20/24 0933]  Encounter Vitals Group     BP 121/79     Girls Systolic BP Percentile      Girls Diastolic BP Percentile      Boys Systolic BP Percentile      Boys Diastolic BP Percentile      Pulse Rate 79     Resp 18     Temp 98 F (36.7 C)     Temp Source Oral     SpO2 96 %     Weight      Height      Head Circumference      Peak Flow      Pain Score 0     Pain Loc      Pain Education      Exclude from Growth Chart  No data found.  Updated Vital Signs BP 121/79 (BP Location: Left Arm)   Pulse 79   Temp 98 F (36.7 C) (Oral)   Resp 18   SpO2 96%   Visual Acuity Right Eye Distance:   Left Eye Distance:   Bilateral Distance:    Right Eye Near:   Left Eye Near:    Bilateral Near:     Physical Exam Vitals and nursing note reviewed.  Constitutional:      General: He is not in acute distress.    Appearance: Normal appearance. He is not ill-appearing.  HENT:     Head: Normocephalic and atraumatic.  Eyes:     Conjunctiva/sclera: Conjunctivae normal.  Cardiovascular:     Rate and Rhythm: Normal rate.  Pulmonary:     Effort: Pulmonary effort is normal. No respiratory distress.  Neurological:     Mental Status: He is alert.  Psychiatric:        Mood and Affect: Mood normal.        Behavior: Behavior normal.        Thought Content: Thought content normal.      UC Treatments / Results  Labs (all labs  ordered are listed, but only abnormal results are displayed) Labs Reviewed - No data to display  EKG   Radiology No results found.  Procedures Procedures (including critical care time)  Medications Ordered in UC Medications  methylPREDNISolone  sodium succinate (SOLU-MEDROL ) 125 mg/2 mL injection 125 mg (125 mg Intramuscular Given 04/20/24 1229)    Initial Impression / Assessment and Plan / UC Course  I have reviewed the triage vital signs and the nursing notes.  Pertinent labs & imaging results that were available during my care of the patient were reviewed by me and considered in my medical decision making (see chart for details).    Unclear etiology of hives.  Will treat with steroid injection in office to hopefully resolve.  Patient to start steroid burst tomorrow.  Encouraged follow-up if no gradual improvement or with any further concerns.  Advised emergency room with any worsening.  Final Clinical Impressions(s) / UC Diagnoses   Final diagnoses:  Hives     Discharge Instructions        Start prednisone  tomorrow.   OK to use Cetirizine  tomorrow for once daily dosing as well.   Please report to Emergency Department with any worsening.       ED Prescriptions     Medication Sig Dispense Auth. Provider   predniSONE  (DELTASONE ) 20 MG tablet Take 2 tablets (40 mg total) by mouth daily with breakfast for 5 days. 10 tablet Billy Asberry FALCON, PA-C      PDMP not reviewed this encounter.   Billy Asberry FALCON, PA-C 04/22/24 1558

## 2024-04-20 NOTE — Discharge Instructions (Signed)
   Start prednisone  tomorrow.   OK to use Cetirizine  tomorrow for once daily dosing as well.   Please report to Emergency Department with any worsening.
